# Patient Record
Sex: Female | Born: 1955 | Race: Black or African American | Hispanic: No | Marital: Married | Smoking: Current every day smoker
Health system: Southern US, Community
[De-identification: ages and names within clinical notes are randomized; demographics above are authoritative.]

## PROBLEM LIST (undated history)

## (undated) DIAGNOSIS — I10 Essential (primary) hypertension: Secondary | ICD-10-CM

## (undated) DIAGNOSIS — R609 Edema, unspecified: Secondary | ICD-10-CM

## (undated) DIAGNOSIS — J42 Unspecified chronic bronchitis: Secondary | ICD-10-CM

## (undated) DIAGNOSIS — J45909 Unspecified asthma, uncomplicated: Secondary | ICD-10-CM

## (undated) DIAGNOSIS — R87810 Cervical high risk human papillomavirus (HPV) DNA test positive: Secondary | ICD-10-CM

## (undated) DIAGNOSIS — M1990 Unspecified osteoarthritis, unspecified site: Secondary | ICD-10-CM

## (undated) DIAGNOSIS — E7800 Pure hypercholesterolemia, unspecified: Secondary | ICD-10-CM

## (undated) DIAGNOSIS — J449 Chronic obstructive pulmonary disease, unspecified: Secondary | ICD-10-CM

## (undated) HISTORY — DX: Unspecified asthma, uncomplicated: J45.909

## (undated) HISTORY — DX: Cervical high risk human papillomavirus (HPV) DNA test positive: R87.810

## (undated) HISTORY — DX: Unspecified chronic bronchitis: J42

## (undated) HISTORY — DX: Essential (primary) hypertension: I10

---

## 2001-08-16 ENCOUNTER — Encounter: Payer: Self-pay | Admitting: Internal Medicine

## 2001-08-16 ENCOUNTER — Ambulatory Visit (HOSPITAL_COMMUNITY): Admission: RE | Admit: 2001-08-16 | Discharge: 2001-08-16 | Payer: Self-pay | Admitting: Internal Medicine

## 2002-03-27 ENCOUNTER — Emergency Department (HOSPITAL_COMMUNITY): Admission: EM | Admit: 2002-03-27 | Discharge: 2002-03-27 | Payer: Self-pay | Admitting: Emergency Medicine

## 2002-08-25 ENCOUNTER — Encounter: Payer: Self-pay | Admitting: Internal Medicine

## 2002-08-25 ENCOUNTER — Ambulatory Visit (HOSPITAL_COMMUNITY): Admission: RE | Admit: 2002-08-25 | Discharge: 2002-08-25 | Payer: Self-pay | Admitting: Internal Medicine

## 2005-12-31 ENCOUNTER — Ambulatory Visit (HOSPITAL_COMMUNITY): Admission: RE | Admit: 2005-12-31 | Discharge: 2005-12-31 | Payer: Self-pay | Admitting: Internal Medicine

## 2006-12-07 HISTORY — PX: COLONOSCOPY: SHX174

## 2007-01-18 ENCOUNTER — Ambulatory Visit (HOSPITAL_COMMUNITY): Admission: RE | Admit: 2007-01-18 | Discharge: 2007-01-18 | Payer: Self-pay | Admitting: Internal Medicine

## 2007-10-25 ENCOUNTER — Ambulatory Visit (HOSPITAL_COMMUNITY): Admission: RE | Admit: 2007-10-25 | Discharge: 2007-10-25 | Payer: Self-pay | Admitting: Gastroenterology

## 2007-10-25 ENCOUNTER — Ambulatory Visit: Payer: Self-pay | Admitting: Gastroenterology

## 2008-03-06 ENCOUNTER — Ambulatory Visit (HOSPITAL_COMMUNITY): Admission: RE | Admit: 2008-03-06 | Discharge: 2008-03-06 | Payer: Self-pay | Admitting: Internal Medicine

## 2010-12-29 ENCOUNTER — Encounter: Payer: Self-pay | Admitting: Internal Medicine

## 2011-04-21 NOTE — Op Note (Signed)
Dana Drake, Dana Drake                ACCOUNT NO.:  [id number]   MEDICAL RECORD NO.:  [id number]          PATIENT TYPE:  AMB   LOCATION:  DAY                           FACILITY:  [facility]   PHYSICIAN:  Kassie Mends, M.D.      DATE OF BIRTH:  [birth year]   DATE OF PROCEDURE:  10/25/2007  DATE OF DISCHARGE:                               OPERATIVE REPORT   PROCEDURE:  Colonoscopy.   INDICATION FOR EXAM:  Ms. Bickhart is a 55 year old female who presents  for average risk colon cancer screening.   FINDINGS:  1. Single diverticulum seen at the hepatic flexure.  Rare sigmoid      diverticula seen.  Otherwise no polyps, masses, inflammatory      changes or arteriovenous malformations seen.  2. Small internal hemorrhoids.  Otherwise normal retroflexed view of      the rectum.   RECOMMENDATIONS:  1. Screening colonoscopy in 10 years.  2. She is given handout on high-fiber diet.  She should follow a high      fiber diet to decrease her risk colon cancer and for management of      her diverticulosis.  She is given a handout on diverticulosis.   MEDICATIONS:  1. Demerol 50 mg IV.  2. Versed 4 mg IV.   PROCEDURE TECHNIQUE:  Physical exam was performed.  Informed consent was  obtained from the patient after explaining the benefits, risks and  alternatives to the procedure.  The patient was connected to monitor and  placed in left lateral position.  Continuous oxygen was provided by  nasal cannula and IV medicine administered through an indwelling  cannula.  After administration of sedation and rectal exam, the  patient's rectum was intubated and the  scope was advanced under direct visualization to the cecum.  The scope  was removed slowly by carefully examining the color, texture, anatomy  and integrity of the mucosa on the way out.  The patient was recovered  in endoscopy and discharged home in satisfactory condition.      Kassie Mends, M.D.  Electronically Signed     SM/MEDQ  D:   10/25/2007  T:  10/25/2007  Job:  [id number]   cc:   Tesfaye D. Felecia Shelling, MD  Fax: [phone]

## 2012-04-29 ENCOUNTER — Emergency Department (HOSPITAL_COMMUNITY)
Admission: EM | Admit: 2012-04-29 | Discharge: 2012-04-29 | Disposition: A | Discharge status: Home / Self Care | Payer: Medicaid Other | Attending: Emergency Medicine | Admitting: Emergency Medicine

## 2012-04-29 ENCOUNTER — Encounter (HOSPITAL_COMMUNITY): Payer: Self-pay | Admitting: *Deleted

## 2012-04-29 ENCOUNTER — Emergency Department (HOSPITAL_COMMUNITY): Payer: Medicaid Other

## 2012-04-29 DIAGNOSIS — J449 Chronic obstructive pulmonary disease, unspecified: Secondary | ICD-10-CM | POA: Insufficient documentation

## 2012-04-29 DIAGNOSIS — J4489 Other specified chronic obstructive pulmonary disease: Secondary | ICD-10-CM | POA: Insufficient documentation

## 2012-04-29 DIAGNOSIS — R609 Edema, unspecified: Secondary | ICD-10-CM | POA: Insufficient documentation

## 2012-04-29 DIAGNOSIS — E7800 Pure hypercholesterolemia, unspecified: Secondary | ICD-10-CM | POA: Insufficient documentation

## 2012-04-29 DIAGNOSIS — F17200 Nicotine dependence, unspecified, uncomplicated: Secondary | ICD-10-CM | POA: Insufficient documentation

## 2012-04-29 HISTORY — DX: Pure hypercholesterolemia, unspecified: E78.00

## 2012-04-29 HISTORY — DX: Edema, unspecified: R60.9

## 2012-04-29 HISTORY — DX: Chronic obstructive pulmonary disease, unspecified: J44.9

## 2012-04-29 LAB — BASIC METABOLIC PANEL
CO2: 26 mEq/L (ref 19–32)
Calcium: 10.5 mg/dL (ref 8.4–10.5)
GFR calc Af Amer: >90 mL/min (ref >90)
Sodium: 139 mEq/L (ref 135–145)

## 2012-04-29 LAB — DIFFERENTIAL
Basophils Absolute: 0 10*3/uL (ref 0.0–0.1)
Eosinophils Relative: 3 % (ref 0–5)
Lymphocytes Relative: 54 % — ABNORMAL HIGH (ref 12–46)
Neutro Abs: 2.2 10*3/uL (ref 1.7–7.7)

## 2012-04-29 LAB — CBC
Platelets: 240 10*3/uL (ref 150–400)
RDW: 14.3 % (ref 11.5–15.5)
WBC: 6.1 10*3/uL (ref 4.0–10.5)

## 2012-04-29 LAB — TROPONIN I: Troponin I: <0.3 ng/mL (ref <0.30)

## 2012-04-29 MED ORDER — FUROSEMIDE 20 MG PO TABS: 20.0000 mg | ORAL_TABLET | Freq: Two times a day (BID) | ORAL | Status: DC

## 2012-04-29 NOTE — Discharge Instructions (Signed)
Stay on a low salt diet.  Edema Edema is an abnormal build-up of fluids in tissues. Because this is partly dependent on gravity (water flows to the lowest place), it is more common in the leg sand thighs (lower extremities). It is also common in the looser tissues, like around the eyes. Painless swelling of the feet and ankles is common and increases as a person ages. It may affect both legs and may include the calves or even thighs. When squeezed, the fluid may move out of the affected area and may leave a dent for a few moments. CAUSES   Prolonged standing or sitting in one place for extended periods of time. Movement helps pump tissue fluid into the veins, and absence of movement prevents this, resulting in edema.   Varicose veins. The valves in the veins do not work as well as they should. This causes fluid to leak into the tissues.   Fluid and salt overload.   Injury, burn, or surgery to the leg, ankle, or foot, may damage veins and allow fluid to leak out.   Sunburn damages vessels. Leaky vessels allow fluid to go out into the sunburned tissues.   Allergies (from insect bites or stings, medications or chemicals) cause swelling by allowing vessels to become leaky.   Protein in the blood helps keep fluid in your vessels. Low protein, as in malnutrition, allows fluid to leak out.   Hormonal changes, including pregnancy and menstruation, cause fluid retention. This fluid may leak out of vessels and cause edema.   Medications that cause fluid retention. Examples are sex hormones, blood pressure medications, steroid treatment, or anti-depressants.   Some illnesses cause edema, especially heart failure, kidney disease, or liver disease.   Surgery that cuts veins or lymph nodes, such as surgery done for the heart or for breast cancer, may result in edema.  DIAGNOSIS  Your caregiver is usually easily able to determine what is causing your swelling (edema) by simply asking what is wrong  (getting a history) and examining you (doing a physical). Sometimes x-rays, EKG (electrocardiogram or heart tracing), and blood work may be done to evaluate for underlying medical illness. TREATMENT  General treatment includes:  Leg elevation (or elevation of the affected body part).   Restriction of fluid intake.   Prevention of fluid overload.   Compression of the affected body part. Compression with elastic bandages or support stockings squeezes the tissues, preventing fluid from entering and forcing it back into the blood vessels.   Diuretics (also called water pills or fluid pills) pull fluid out of your body in the form of increased urination. These are effective in reducing the swelling, but can have side effects and must be used only under your caregiver's supervision. Diuretics are appropriate only for some types of edema.  The specific treatment can be directed at any underlying causes discovered. Heart, liver, or kidney disease should be treated appropriately. HOME CARE INSTRUCTIONS   Elevate the legs (or affected body part) above the level of the heart, while lying down.   Avoid sitting or standing still for prolonged periods of time.   Avoid putting anything directly under the knees when lying down, and do not wear constricting clothing or garters on the upper legs.   Exercising the legs causes the fluid to work back into the veins and lymphatic channels. This may help the swelling go down.   The pressure applied by elastic bandages or support stockings can help reduce ankle swelling.  A low-salt diet may help reduce fluid retention and decrease the ankle swelling.   Take any medications exactly as prescribed.  SEEK MEDICAL CARE IF:  Your edema is not responding to recommended treatments. SEEK IMMEDIATE MEDICAL CARE IF:   You develop shortness of breath or chest pain.   You cannot breathe when you lay down; or if, while lying down, you have to get up and go to the  window to get your breath.   You are having increasing swelling without relief from treatment.   You develop a fever over 102 F (38.9 C).   You develop pain or redness in the areas that are swollen.   Tell your caregiver right away if you have gained 3 lb/1.4 kg in 1 day or 5 lb/2.3 kg in a week.  MAKE SURE YOU:   Understand these instructions.   Will watch your condition.   Will get help right away if you are not doing well or get worse.  Document Released: 11/23/2005 Document Revised: 11/12/2011 Document Reviewed: 07/11/2008 [facility] Patient Information 2012 [place], [place].  Furosemide tablets What is this medicine? FUROSEMIDE (fyoor [place] se mide) is a diuretic. It helps you make more urine and to lose salt and excess water from your body. This medicine is used to treat high blood pressure, and edema or swelling from heart, kidney, or liver disease. This medicine may be used for other purposes; ask your health care provider or pharmacist if you have questions. What should I tell my health care provider before I take this medicine? They need to know if you have any of these conditions: -abnormal blood electrolytes -diarrhea or vomiting -gout -heart disease -kidney disease, small amounts of urine, or difficulty passing urine -liver disease -an unusual or allergic reaction to furosemide, sulfa drugs, other medicines, foods, dyes, or preservatives -pregnant or trying to get pregnant -breast-feeding How should I use this medicine? Take this medicine by mouth with a glass of water. Follow the directions on the prescription label. You may take this medicine with or without food. If it upsets your stomach, take it with food or milk. Do not take your medicine more often than directed. Remember that you will need to pass more urine after taking this medicine. Do not take your medicine at a time of day that will cause you problems. Do not take at bedtime. Talk to your pediatrician  regarding the use of this medicine in children. While this drug may be prescribed for selected conditions, precautions do apply. Overdosage: If you think you have taken too much of this medicine contact a poison control center or emergency room at once. NOTE: This medicine is only for you. Do not share this medicine with others. What if I miss a dose? If you miss a dose, take it as soon as you can. If it is almost time for your next dose, take only that dose. Do not take double or extra doses. What may interact with this medicine? -aspirin and aspirin-like medicines -certain antibiotics -chloral hydrate -cisplatin -cyclosporine -digoxin -diuretics -laxatives -lithium -medicines for blood pressure -medicines that relax muscles for surgery -methotrexate -NSAIDs, medicines for pain and inflammation like ibuprofen, naproxen, or indomethacin -phenytoin -steroid medicines like prednisone or cortisone -sucralfate This list may not describe all possible interactions. Give your health care provider a list of all the medicines, herbs, non-prescription drugs, or dietary supplements you use. Also tell them if you smoke, drink alcohol, or use illegal drugs. Some items may interact with your  medicine. What should I watch for while using this medicine? Visit your doctor or health care professional for regular checks on your progress. Check your blood pressure regularly. Ask your doctor or health care professional what your blood pressure should be, and when you should contact him or her. If you are a diabetic, check your blood sugar as directed. You may need to be on a special diet while taking this medicine. Check with your doctor. Also, ask how many glasses of fluid you need to drink a day. You must not get dehydrated. You may get drowsy or dizzy. Do not drive, use machinery, or do anything that needs mental alertness until you know how this drug affects you. Do not stand or sit up quickly, especially  if you are an older patient. This reduces the risk of dizzy or fainting spells. Alcohol can make you more drowsy and dizzy. Avoid alcoholic drinks. This medicine can make you more sensitive to the sun. Keep out of the sun. If you cannot avoid being in the sun, wear protective clothing and use sunscreen. Do not use sun lamps or tanning beds/booths. What side effects may I notice from receiving this medicine? Side effects that you should report to your doctor or health care professional as soon as possible: -blood in urine or stools -dry mouth -fever or chills -hearing loss or ringing in the ears -irregular heartbeat -muscle pain or weakness, cramps -skin rash -stomach upset, pain, or nausea -tingling or numbness in the hands or feet -unusually weak or tired -vomiting or diarrhea -yellowing of the eyes or skin Side effects that usually do not require medical attention (report to your doctor or health care professional if they continue or are bothersome): -headache -loss of appetite -unusual bleeding or bruising This list may not describe all possible side effects. Call your doctor for medical advice about side effects. You may report side effects to FDA at 1-800-FDA-1088. Where should I keep my medicine? Keep out of the reach of children. Store at room temperature between 15 and 30 degrees C (59 and 86 degrees F). Protect from light. Throw away any unused medicine after the expiration date. NOTE: This sheet is a summary. It may not cover all possible information. If you have questions about this medicine, talk to your doctor, pharmacist, or health care provider.  2012, [employer]. (11/11/2009 4:24:50 PM)

## 2012-04-29 NOTE — ED Provider Notes (Signed)
History     CSN: [id number]  Arrival date & time 04/29/12  1535   First MD Initiated Contact with Patient 04/29/12 1702      Chief Complaint  Patient presents with  . Leg Pain    (Consider location/radiation/quality/duration/timing/severity/associated sxs/prior treatment) Patient is a 56 y.o. female presenting with leg pain. The history is provided by the patient.  Leg Pain   She has been having swelling in her legs over the last 4 days. Is getting gradually worse. She has a history of peripheral edema and has not been able to obtain her medications because of cost. Is also complaining of some aching in her calves and knees. She has had some mild, intermittent chest pain with the last episode of chest pain being yesterday. She also has some mild dyspnea. There's been no nausea or vomiting or diarrhea. She denies fever, chills, sweats. She has been on water pills in the past.  Past Medical History  Diagnosis Date  . COPD (chronic obstructive pulmonary disease)   . High cholesterol   . Fluid retention     History reviewed. No pertinent past surgical history.  No family history on file.  History  Substance Use Topics  . Smoking status: Current Everyday Smoker    Types: Cigarettes  . Smokeless tobacco: Not on file  . Alcohol Use: No    OB History    Grav Para Term Preterm Abortions TAB SAB Ect Mult Living                  Review of Systems  All other systems reviewed and are negative.    Allergies  Review of patient's allergies indicates no known allergies.  Home Medications  No current outpatient prescriptions on file.  BP 141/70  Pulse 85  Temp(Src) 99 F (37.2 C) (Oral)  Resp 20  Ht 5' (1.524 m)  Wt 152 lb (68.947 kg)  BMI 29.69 kg/m2  SpO2 99%  Physical Exam  Nursing note and vitals reviewed.  56 year old female is resting comfortably and in no acute distress. Vital signs are significant for borderline hypertension with blood pressure 141/70. Oxygen  saturation is 99% which is normal. Head is normal cephalic and atraumatic. PERRLA, EOMI. This is scleral icterus. Oropharynx is clear. Neck is nontender and supple without adenopathy or JVD. Back is nontender. Lungs are clear without rales, wheezes, or rhonchi. Heart has regular rate rhythm without murmur. Abdomen is soft, flat, nontender without masses or hepatosplenomegaly. Extremities have 1+ edema. There is no erythema or warmth. There is no palpable cords. Negative Homans sign. Skin is dry without rash. Neurologic: Mental status is normal, cranial nerves are intact, there are no motor or sensory deficits.  ED Course  Procedures (including critical care time)  Results for orders placed during the hospital encounter of 04/29/12  CBC      Component Value Range   WBC 6.1  4.0 - 10.5 (K/uL)   RBC 4.47  3.87 - 5.11 (MIL/uL)   Hemoglobin 13.4  12.0 - 15.0 (g/dL)   HCT 40.9  81.1 - 91.4 (%)   MCV 87.7  78.0 - 100.0 (fL)   MCH 30.0  26.0 - 34.0 (pg)   MCHC 34.2  30.0 - 36.0 (g/dL)   RDW 78.2  95.6 - 21.3 (%)   Platelets 240  150 - 400 (K/uL)  DIFFERENTIAL      Component Value Range   Neutrophils Relative 36 (*) 43 - 77 (%)   Neutro Abs 2.2  1.7 - 7.7 (K/uL)   Lymphocytes Relative 54 (*) 12 - 46 (%)   Lymphs Abs 3.3  0.7 - 4.0 (K/uL)   Monocytes Relative 7  3 - 12 (%)   Monocytes Absolute 0.4  0.1 - 1.0 (K/uL)   Eosinophils Relative 3  0 - 5 (%)   Eosinophils Absolute 0.2  0.0 - 0.7 (K/uL)   Basophils Relative 1  0 - 1 (%)   Basophils Absolute 0.0  0.0 - 0.1 (K/uL)  BASIC METABOLIC PANEL      Component Value Range   Sodium 139  135 - 145 (mEq/L)   Potassium 4.6  3.5 - 5.1 (mEq/L)   Chloride 103  96 - 112 (mEq/L)   CO2 26  19 - 32 (mEq/L)   Glucose, Bld 95  70 - 99 (mg/dL)   BUN 12  6 - 23 (mg/dL)   Creatinine, Ser 1.61  0.50 - 1.10 (mg/dL)   Calcium 09.6  8.4 - 10.5 (mg/dL)   GFR calc non Af Amer >90  >90 (mL/min)   GFR calc Af Amer >90  >90 (mL/min)  TROPONIN I      Component  Value Range   Troponin I <0.30  <0.30 (ng/mL)   Dg Chest 2 View  04/29/2012  *RADIOLOGY REPORT*  Clinical Data: Chest pain.  Bilateral leg swelling.  CHEST - 2 VIEW  Comparison: None.  Findings: The heart size and pulmonary vascularity are normal and the lungs are clear.  No acute osseous abnormality.  IMPRESSION: Normal exam.  Original Report Authenticated By: Gwynn Burly, M.D.    Date: 04/29/2012  Rate: 60  Rhythm: normal sinus rhythm  QRS Axis: normal  Intervals: normal  ST/T Wave abnormalities: nonspecific T wave changes  Conduction Disutrbances:Incomplete right bundle-branch block  Narrative Interpretation: Q waves in the anteroseptal leads consistent with old anteroseptal myocardial infarction, incomplete right bundle-branch block, nonspecific T wave flattening diffusely. No old ECG available for comparison.  Old EKG Reviewed: none available     1. Peripheral edema       MDM  Peripheral edema. EKG and troponin will be checked given chest pain. Since last episode of pain was yesterday, a single troponin will be sufficient to rule out cardiac injury. Chest x-ray or be obtained to evaluate for possible congestive heart failure.   Workup is unremarkable. She is sent home with a prescription for Lasix.     Dione Booze, MD 04/29/12 [phone]

## 2012-04-29 NOTE — ED Notes (Signed)
Patient with no complaints at this time. Respirations even and unlabored. Skin warm/dry. Discharge instructions reviewed with patient at this time. Patient given opportunity to voice concerns/ask questions. Patient discharged at this time and left Emergency Department with steady gait.   

## 2012-04-29 NOTE — ED Notes (Signed)
Reports increased fluid/swelling/pain to bil legs x 5 days.

## 2014-06-29 ENCOUNTER — Other Ambulatory Visit (HOSPITAL_COMMUNITY): Payer: Self-pay | Admitting: Internal Medicine

## 2014-06-29 DIAGNOSIS — Z1231 Encounter for screening mammogram for malignant neoplasm of breast: Secondary | ICD-10-CM

## 2014-07-04 ENCOUNTER — Ambulatory Visit (HOSPITAL_COMMUNITY): Payer: Medicaid Other

## 2014-07-16 ENCOUNTER — Ambulatory Visit (HOSPITAL_COMMUNITY)
Admission: RE | Admit: 2014-07-16 | Discharge: 2014-07-16 | Disposition: A | Discharge status: Home / Self Care | Payer: Medicaid Other | Source: Ambulatory Visit | Attending: Internal Medicine | Admitting: Internal Medicine

## 2014-07-16 DIAGNOSIS — Z1231 Encounter for screening mammogram for malignant neoplasm of breast: Secondary | ICD-10-CM | POA: Diagnosis present

## 2015-10-17 ENCOUNTER — Other Ambulatory Visit (HOSPITAL_COMMUNITY): Payer: Self-pay | Admitting: Internal Medicine

## 2015-10-17 DIAGNOSIS — Z1231 Encounter for screening mammogram for malignant neoplasm of breast: Secondary | ICD-10-CM

## 2015-10-23 ENCOUNTER — Ambulatory Visit (HOSPITAL_COMMUNITY): Payer: Medicaid Other

## 2016-07-31 ENCOUNTER — Other Ambulatory Visit (HOSPITAL_COMMUNITY): Payer: Self-pay | Admitting: Internal Medicine

## 2016-07-31 DIAGNOSIS — Z1231 Encounter for screening mammogram for malignant neoplasm of breast: Secondary | ICD-10-CM

## 2016-08-06 ENCOUNTER — Ambulatory Visit (HOSPITAL_COMMUNITY)
Admission: RE | Admit: 2016-08-06 | Discharge: 2016-08-06 | Disposition: A | Discharge status: Home / Self Care | Payer: Medicaid Other | Source: Ambulatory Visit | Attending: Internal Medicine | Admitting: Internal Medicine

## 2016-08-06 DIAGNOSIS — Z1231 Encounter for screening mammogram for malignant neoplasm of breast: Secondary | ICD-10-CM | POA: Insufficient documentation

## 2016-11-11 ENCOUNTER — Encounter: Payer: Self-pay | Admitting: *Deleted

## 2016-11-20 ENCOUNTER — Encounter: Payer: Self-pay | Admitting: Adult Health

## 2016-11-20 ENCOUNTER — Other Ambulatory Visit (HOSPITAL_COMMUNITY)
Admission: RE | Admit: 2016-11-20 | Discharge: 2016-11-20 | Disposition: A | Discharge status: Home / Self Care | Payer: Medicaid Other | Source: Ambulatory Visit | Attending: Adult Health | Admitting: Adult Health

## 2016-11-20 ENCOUNTER — Ambulatory Visit (INDEPENDENT_AMBULATORY_CARE_PROVIDER_SITE_OTHER): Payer: Medicaid Other | Admitting: Adult Health

## 2016-11-20 VITALS — BP 142/72 | HR 97 | Ht 61.0 in | Wt 152.0 lb

## 2016-11-20 DIAGNOSIS — Z01419 Encounter for gynecological examination (general) (routine) without abnormal findings: Secondary | ICD-10-CM | POA: Diagnosis present

## 2016-11-20 DIAGNOSIS — Z0000 Encounter for general adult medical examination without abnormal findings: Secondary | ICD-10-CM

## 2016-11-20 DIAGNOSIS — Z1151 Encounter for screening for human papillomavirus (HPV): Secondary | ICD-10-CM | POA: Insufficient documentation

## 2016-11-20 DIAGNOSIS — Z1212 Encounter for screening for malignant neoplasm of rectum: Secondary | ICD-10-CM

## 2016-11-20 LAB — HEMOCCULT GUIAC POC 1CARD (OFFICE): Fecal Occult Blood, POC: NEGATIVE

## 2016-11-20 NOTE — Progress Notes (Signed)
Patient ID: Dana Drake, female   DOB: 1956-01-07, 60 y.o.   MRN: [id number] History of Present Illness: Brannon is a 60 year old black female, married in for pap and pelvic exam. Has been over 3 years since last pap, no history of abnormal pap smears that she remembers.  PCP is Dr Legrand Rams.    Current Medications, Allergies, Past Medical History, Past Surgical History, Family History and Social History were reviewed in [employer] record.     Review of Systems: Patient denies any headaches, hearing loss, fatigue, blurred vision, shortness of breath, chest pain, abdominal pain, problems with bowel movements, urination, or intercourse(not having sex). No joint pain or mood swings.    Physical Exam:BP (!) 142/72 (BP Location: Right Arm, Patient Position: Sitting, Cuff Size: Normal)   Pulse 97   Ht 5\' 1"  (1.549 m)   Wt 152 lb (68.9 kg)   BMI 28.72 kg/m  General:  Well developed, well nourished, no acute distress Skin:  Warm and dry Neck:  Midline trachea, normal thyroid, good ROM, no lymphadenopathy, no carotid bruits heard Lungs; Clear to auscultation bilaterally Breast:  Deferred, pt says she had normal mammogram recently Cardiovascular: Regular rate and rhythm Abdomen:  Soft, non tender, no hepatosplenomegaly Pelvic:  External genitalia is normal in appearance, no lesions.  The vagina has decreased color,moisture and rugae. Urethra has no lesions or masses. The cervix is bulbous, pap with HPV performed.  Uterus is felt to be normal size, shape, and contour.  No adnexal masses or tenderness noted.Bladder is non tender, no masses felt. Rectal: Good sphincter tone, no polyps, or hemorrhoids felt.  Hemoccult negative. Extremities/musculoskeletal:  No swelling or varicosities noted, no clubbing or cyanosis Psych:  No mood changes, alert and cooperative,seems happy PHQ 2 score 0.  Impression: 1. Encounter for routine gynecological examination with Papanicolaou smear of  cervix       Plan: Pap in 3 years if normal Physical and labs with PCP Mammogram yearly Colonoscopy advised

## 2016-11-20 NOTE — Patient Instructions (Signed)
Pap in 3 years if normal  

## 2016-11-20 NOTE — Addendum Note (Signed)
Addended by: Traci Sermon A on: 11/20/2016 01:22 PM   Modules accepted: Orders

## 2016-11-24 ENCOUNTER — Telehealth: Payer: Self-pay | Admitting: Adult Health

## 2016-11-24 LAB — CYTOLOGY - PAP
Diagnosis: NEGATIVE
HPV: DETECTED — AB

## 2016-11-24 NOTE — Telephone Encounter (Signed)
Number not working

## 2016-12-02 ENCOUNTER — Encounter: Payer: Self-pay | Admitting: Adult Health

## 2016-12-02 DIAGNOSIS — R87810 Cervical high risk human papillomavirus (HPV) DNA test positive: Secondary | ICD-10-CM | POA: Insufficient documentation

## 2016-12-02 HISTORY — DX: Cervical high risk human papillomavirus (HPV) DNA test positive: R87.810

## 2017-10-20 ENCOUNTER — Encounter: Payer: Self-pay | Admitting: Gastroenterology

## 2017-11-22 ENCOUNTER — Other Ambulatory Visit: Payer: Self-pay | Admitting: Adult Health

## 2017-11-23 ENCOUNTER — Encounter: Payer: Self-pay | Admitting: Adult Health

## 2017-11-23 ENCOUNTER — Ambulatory Visit: Payer: Medicaid Other | Admitting: Adult Health

## 2017-11-23 ENCOUNTER — Other Ambulatory Visit (HOSPITAL_COMMUNITY)
Admission: RE | Admit: 2017-11-23 | Discharge: 2017-11-23 | Disposition: A | Discharge status: Home / Self Care | Payer: Medicaid Other | Source: Ambulatory Visit | Attending: Adult Health | Admitting: Adult Health

## 2017-11-23 VITALS — BP 130/70 | HR 88 | Ht 60.0 in | Wt 142.5 lb

## 2017-11-23 DIAGNOSIS — R87810 Cervical high risk human papillomavirus (HPV) DNA test positive: Secondary | ICD-10-CM | POA: Diagnosis not present

## 2017-11-23 DIAGNOSIS — Z01419 Encounter for gynecological examination (general) (routine) without abnormal findings: Secondary | ICD-10-CM

## 2017-11-23 DIAGNOSIS — Z124 Encounter for screening for malignant neoplasm of cervix: Secondary | ICD-10-CM

## 2017-11-23 DIAGNOSIS — Z0001 Encounter for general adult medical examination with abnormal findings: Secondary | ICD-10-CM | POA: Diagnosis not present

## 2017-11-23 DIAGNOSIS — Z1212 Encounter for screening for malignant neoplasm of rectum: Secondary | ICD-10-CM | POA: Diagnosis not present

## 2017-11-23 DIAGNOSIS — Z1211 Encounter for screening for malignant neoplasm of colon: Secondary | ICD-10-CM | POA: Insufficient documentation

## 2017-11-23 DIAGNOSIS — Z01411 Encounter for gynecological examination (general) (routine) with abnormal findings: Secondary | ICD-10-CM | POA: Diagnosis not present

## 2017-11-23 LAB — HEMOCCULT GUIAC POC 1CARD (OFFICE): Fecal Occult Blood, POC: NEGATIVE

## 2017-11-23 NOTE — Progress Notes (Signed)
Patient ID: Dana Drake, female   DOB: 11/27/1956, 61 y.o.   MRN: [id number] History of Present Illness: Dana Drake is a 61 year old black female in for well woman gyn exam and pap, pap last year +HPV,negative for malignancy 11/20/16. PCP is Dr Legrand Rams.    Current Medications, Allergies, Past Medical History, Past Surgical History, Family History and Social History were reviewed in [employer] record.     Review of Systems:  Patient denies any headaches, hearing loss, fatigue, blurred vision, shortness of breath, chest pain, abdominal pain, problems with bowel movements, urination, or intercourse. No joint pain or mood swings.   Physical Exam:BP 130/70 (BP Location: Left Arm, Patient Position: Sitting, Cuff Size: Normal)   Pulse 88   Ht 5' (1.524 m)   Wt 142 lb 8 oz (64.6 kg)   BMI 27.83 kg/m  General:  Well developed, well nourished, no acute distress Skin:  Warm and dry Neck:  Midline trachea, normal thyroid, good ROM, no lymphadenopathy,no carotid bruits heard  Lungs; Clear to auscultation bilaterally Breast:  No dominant palpable mass, retraction, or nipple discharge Cardiovascular: Regular rate and rhythm Abdomen:  Soft, non tender, no hepatosplenomegaly Pelvic:  External genitalia is normal in appearance, no lesions.  The vagina is normal in appearance. Urethra has no lesions or masses. The cervix is bulbous. Pap with HPV and GC/CHL performed.  Uterus is felt to be normal size, shape, and contour.  No adnexal masses or tenderness noted.Bladder is non tender, no masses felt. Rectal: Good sphincter tone, no polyps, or hemorrhoids felt.  Hemoccult negative. Extremities/musculoskeletal:  No swelling or varicosities noted, no clubbing or cyanosis Psych:  No mood changes, alert and cooperative,seems happy PHQ 2 score 0.  Impression: 1. Encounter for routine gynecological examination with Papanicolaou smear of cervix   2. Routine cervical smear   3. Cervical high  risk HPV (human papillomavirus) test positive   4. Screening for colorectal cancer       Plan: Physical in 1 year Pap in 2021 if normal Labs with PCP  Mammogram advised now and yearly  Referred to Dr Oneida Alar for colonoscopy

## 2017-11-24 ENCOUNTER — Encounter: Payer: Self-pay | Admitting: Gastroenterology

## 2017-11-24 LAB — CYTOLOGY - PAP
CHLAMYDIA, DNA PROBE: NEGATIVE
Diagnosis: NEGATIVE
HPV: DETECTED — AB
Neisseria Gonorrhea: NEGATIVE

## 2017-11-26 ENCOUNTER — Telehealth: Payer: Self-pay | Admitting: Adult Health

## 2017-11-26 MED ORDER — METRONIDAZOLE 500 MG PO TABS: 500.0000 mg | ORAL_TABLET | Freq: Two times a day (BID) | ORAL | 0 refills | Status: DC

## 2017-11-26 NOTE — Telephone Encounter (Signed)
Left message that pap was negative for malignancy and GC/CHL but +HPV again , will need colpo, so call for colpo appt with Dr Elonda Husky and also had BV on pap ,rx for flagyl sent to  [facility]

## 2017-12-02 ENCOUNTER — Telehealth: Payer: Self-pay | Admitting: Adult Health

## 2017-12-02 NOTE — Telephone Encounter (Signed)
Left message to call me about pap

## 2017-12-13 ENCOUNTER — Encounter: Payer: Self-pay | Admitting: Adult Health

## 2017-12-14 ENCOUNTER — Ambulatory Visit (INDEPENDENT_AMBULATORY_CARE_PROVIDER_SITE_OTHER): Payer: Self-pay

## 2017-12-14 DIAGNOSIS — Z1211 Encounter for screening for malignant neoplasm of colon: Secondary | ICD-10-CM

## 2017-12-14 MED ORDER — PEG-KCL-NACL-NASULF-NA ASC-C 100 G PO SOLR: 1.0000 | ORAL | 0 refills | Status: DC

## 2017-12-14 NOTE — Progress Notes (Signed)
Gastroenterology Pre-Procedure Review  Request Date:12/14/17 Requesting Physician: Jennifer Griffin NP  PATIENT REVIEW QUESTIONS: The patient responded to the following health history questions as indicated:    1. Diabetes Melitis: no 2. Joint replacements in the past 12 months: no 3. Major health problems in the past 3 months: no 4. Has an artificial valve or MVP: no 5. Has a defibrillator: no 6. Has been advised in past to take antibiotics in advance of a procedure like teeth cleaning: no 7. Family history of colon cancer: no  8. Alcohol Use: no 9. History of sleep apnea: no  10. History of coronary artery or other vascular stents placed within the last 12 months: no 11. History of any prior anesthesia complications: no    MEDICATIONS & ALLERGIES:    Patient reports the following regarding taking any blood thinners:   Plavix? no Aspirin? no Coumadin? no Brilinta? no Xarelto? no Eliquis? no Pradaxa? no Savaysa? no Effient? no  Patient confirms/reports the following medications:  Current Outpatient Medications  Medication Sig Dispense Refill  . albuterol (PROAIR HFA) 108 (90 BASE) MCG/ACT inhaler Inhale 2 puffs into the lungs every 6 (six) hours as needed.    . atorvastatin (LIPITOR) 40 MG tablet Take 40 mg by mouth daily.    . BETA CAROTENE PO Take by mouth daily.    . lisinopril-hydrochlorothiazide (PRINZIDE,ZESTORETIC) 20-12.5 MG tablet Take 1 tablet by mouth daily.    . naproxen (NAPROSYN) 500 MG tablet Take 500 mg by mouth 2 (two) times daily with a meal.    . peg 3350 powder (MOVIPREP) 100 g SOLR Take 1 kit (200 g total) by mouth as directed. 1 kit 0  . UNABLE TO FIND Pain med-unsure of name-daily     No current facility-administered medications for this visit.     Patient confirms/reports the following allergies:  No Known Allergies  No orders of the defined types were placed in this encounter.   AUTHORIZATION INFORMATION Primary Insurance: [place] [employer],  ID #:  [id number] Pre-Cert / Auth required: no   SCHEDULE INFORMATION: Procedure has been scheduled as follows:  Date: 01/07/18 Time: 9:30  Location: [facility]  This Gastroenterology Pre-Precedure Review Form is being routed to the following provider(s): Anna Boone NP  

## 2017-12-14 NOTE — Patient Instructions (Signed)
SPLIT MOVIPREP INSTRUCTION SHEET  Please notify us immediately if you are diabetic, take iron supplements, or if you are on coumadin or any blood thinners.  Patient Name:  Dana Drake Date of procedure:  01/07/18 Time to register at [place] Stay:  8:30 Provider:  Dr. Lynn Ito will need to purchase 1 fleet enema   Please hold the following medications: none  01/06/18 1 Day prior to procedure:     CLEAR LIQUIDS ALL DAY--NO SOLID FOODS!  Diabetic Medication Instructions:  none   At 5:00 PM Begin the prep as follows:     Empty 1 pouch A and 1 pouch B into disposable container.  Add lukewarm drinking water to the top line of the container.  Mix to dissolve.  You can mix solution ahead of time & refrigerate prior to drinking.  The solution should be used within 24 hours.  The container is divided by 4 marks.  Every 15 minutes, drink the solution down to the next mark (approx 8 oz) until the liter is complete.  Be sure to drink 16 ounces of clear liquid of your choice (this is important to make sure you stay hydrated and the prep works)   [place]  ([place]). You may take heart, blood pressure, or breathing  medications  01/07/18  Day of Procedure    Diabetic medications adjustments for today:none   Four hours before your procedure at 5:30 complete prep as follows:  Empty 1 pouch A and 1 pouch B into disposable container.  Add lukewarm drinking water to the top line of the container.  Mix to dissolve.  You can mix solution ahead of time & refrigerate prior to drinking.  The solution should be used within 24 hours.  The container is divided by 4 marks.  Every 15 minutes, drink the solution down to the next mark (approx 8 oz) until the liter is complete.  You must complete the entire prep to ensure the most effective cleaning.  Be sure to drink 16 ounces of clear liquid of your choice (this is important to make sure you stay  hydrated and the prep works)  Should complete within 1 and 1/2 hours.   NOTHING TO EAT OR DRINK AFTER 7:30AM (2 hours before your procedure)  Give yourself one Fleet enema about 1 hour prior to leaving for the hospital.  You may take TYLENOL products.  Please continue your regular medications unless we have instructed you otherwise.     Please note, on the day of your procedure you MUST be accompanied by an adult who is willing to assume responsibility for you at time of discharge. If you do not have such person with you, your procedure will have to be rescheduled.                                                                                                                     Please leave ALL jewelry  at home prior to coming to the hospital for your procedure.   *It is your responsibility to check with your insurance company for the benefits of coverage you have for this procedure. Unfortunately, not all insurance companies have benefits to cover all or part of these types of procedures. It is your responsibility to check your benefits, however we will be glad to assist you with any codes your insurance company may need.   Please note that most insurance companies will not cover a screening colonoscopy for people under the age of 108  For example, with some insurance companies you may have benefits for a screening colonoscopy, but if polyps are found the diagnosis will change and then you may have a deductible that will need to be met. Please make sure you check your benefits for screening colonoscopy as well as a diagnostic colonoscopy.   CLEAR LIQUIDS: (NO RED) Jello Apple Juice  White Grape Juice Water Banana popsicles  Kool-Aid  Coffee(No cream or milk) Tea (No cream or milk) Soft drinks Broth (fat free beef/chicken/vegetable)  Clear liquids allow you to see your fingers on the other side of the glass.  Be sure they are NOT RED in color, cloudy, but CLEAR.  Do Not Eat: Dairy  products of any kind Cranberry juice Tomato or V8 Juice  Orange Juice Grapefruit Juice  Red Grape Juice Solid foods like cereal, oatmeal, yogurt, fruits, vegetables, creamed soups, eggs, bread, etc   HELPFUL HINTS TO MAKE DRINKING EASIER: -Make sure prep is extremely COLD.  Refrigerate the night before.  You may also put in freezer. -You may try mixing Crystal Light or Country Time Lemonade if you prefer.  MIx in small amounts.  Add more if necessary. -Trying drinking through a straw. -Rinse mouth with water or mouthwash between glasses to remove aftertaste. -Try sipping on a cold beverage/ice popsicles between glasses of prep. -Place a piece of sugar-free hard candy in mouth between glasses. -If you become nauseated, try consuming smaller amounts or stretch out the time between glasses.  Stop for 30 minutes to an hour & slowly start back drinking.  Call our office with any questions or concerns at [phone].  Thank You

## 2017-12-20 NOTE — Progress Notes (Signed)
Appropriate.

## 2018-01-03 ENCOUNTER — Telehealth: Payer: Self-pay | Admitting: *Deleted

## 2018-01-03 NOTE — Telephone Encounter (Signed)
Noted. She will need another nurse visit so we can reschedule her when she feels better.

## 2018-01-03 NOTE — Telephone Encounter (Signed)
Patient called in to cancel her procedure for 01/07/18. Patient states she has a flu. She reports she will call back to reschedule. I have called Hoyle Sauer and cancelled procedure. I have made Almyra Free aware.

## 2018-01-07 ENCOUNTER — Ambulatory Visit (HOSPITAL_COMMUNITY): Admission: RE | Admit: 2018-01-07 | Payer: Medicaid Other | Source: Ambulatory Visit | Admitting: Gastroenterology

## 2018-01-07 ENCOUNTER — Encounter (HOSPITAL_COMMUNITY): Admission: RE | Payer: Self-pay | Source: Ambulatory Visit

## 2018-01-07 SURGERY — COLONOSCOPY
Anesthesia: Moderate Sedation

## 2018-01-27 ENCOUNTER — Ambulatory Visit: Payer: Medicaid Other

## 2018-02-15 ENCOUNTER — Ambulatory Visit (INDEPENDENT_AMBULATORY_CARE_PROVIDER_SITE_OTHER): Payer: Medicaid Other

## 2018-02-15 DIAGNOSIS — Z1211 Encounter for screening for malignant neoplasm of colon: Secondary | ICD-10-CM

## 2018-02-15 NOTE — Patient Instructions (Addendum)
SPLIT MOVIPREP INSTRUCTION SHEET  Please notify us immediately if you are diabetic, take iron supplements, or if you are on coumadin or any blood thinners.  Patient Name:  Dana Drake Date of procedure:  03/07/18 Time to register at [place] Stay:  1:00 Provider:  Dr. Lynn Ito will need to purchase 1 fleet enema           03/06/18 1 Day prior to procedure:     CLEAR LIQUIDS ALL DAY--NO SOLID FOODS!    At 5:00 PM Begin the prep as follows:     Empty 1 pouch A and 1 pouch B into disposable container.  Add lukewarm drinking water to the top line of the container.  Mix to dissolve.  You can mix solution ahead of time & refrigerate prior to drinking.  The solution should be used within 24 hours.  The container is divided by 4 marks.  Every 15 minutes, drink the solution down to the next mark (approx 8 oz) until the liter is complete.  Be sure to drink 16 ounces of clear liquid of your choice (this is important to make sure you stay hydrated and the prep works)   [place]  ([place]). You may take heart, blood pressure, or breathing  medications      03/07/18  Day of Procedure      Five hours before your procedure at  9:00am complete prep as follows:  Empty 1 pouch A and 1 pouch B into disposable container.  Add lukewarm drinking water to the top line of the container.  Mix to dissolve.  You can mix solution ahead of time & refrigerate prior to drinking.  The solution should be used within 24 hours.  The container is divided by 4 marks.  Every 15 minutes, drink the solution down to the next mark (approx 8 oz) until the liter is complete.  You must complete the entire prep to ensure the most effective cleaning.  Be sure to drink 16 ounces of clear liquid of your choice (this is important to make sure you stay hydrated and the prep works)  Should complete within 1 and 1/2 hours.   NOTHING TO EAT OR DRINK AFTER   11:00    AM  (3 hours before your procedure)  Give yourself one Fleet enema about 1 hour prior to leaving for the hospital.  You may take TYLENOL products.  Please continue your regular medications unless we have instructed you otherwise.     Please note, on the day of your procedure you MUST be accompanied by an adult who is willing to assume responsibility for you at time of discharge. If you do not have such person with you, your procedure will have to be rescheduled.                                                                                                                     Please leave  ALL jewelry at home prior to coming to the hospital for your procedure.   *It is your responsibility to check with your insurance company for the benefits of coverage you have for this procedure. Unfortunately, not all insurance companies have benefits to cover all or part of these types of procedures. It is your responsibility to check your benefits, however we will be glad to assist you with any codes your insurance company may need.   Please note that most insurance companies will not cover a screening colonoscopy for people under the age of 59  For example, with some insurance companies you may have benefits for a screening colonoscopy, but if polyps are found the diagnosis will change and then you may have a deductible that will need to be met. Please make sure you check your benefits for screening colonoscopy as well as a diagnostic colonoscopy.   CLEAR LIQUIDS: (NO RED) Jello Apple Juice  White Grape Juice Water Banana popsicles  Kool-Aid  Coffee(No cream or milk) Tea (No cream or milk) Soft drinks Broth (fat free beef/chicken/vegetable)  Clear liquids allow you to see your fingers on the other side of the glass.  Be sure they are NOT RED in color, cloudy, but CLEAR.  Do Not Eat: Dairy products of any kind Cranberry juice Tomato or V8 Juice  Orange Juice Grapefruit Juice  Red Grape Juice Solid foods  like cereal, oatmeal, yogurt, fruits, vegetables, creamed soups, eggs, bread, etc   HELPFUL HINTS TO MAKE DRINKING EASIER: -Make sure prep is extremely COLD.  Refrigerate the night before.  You may also put in freezer. -You may try mixing Crystal Light or Country Time Lemonade if you prefer.  MIx in small amounts.  Add more if necessary. -Trying drinking through a straw. -Rinse mouth with water or mouthwash between glasses to remove aftertaste. -Try sipping on a cold beverage/ice popsicles between glasses of prep. -Place a piece of sugar-free hard candy in mouth between glasses. -If you become nauseated, try consuming smaller amounts or stretch out the time between glasses.  Stop for 30 minutes to an hour & slowly start back drinking.  Call our office with any questions or concerns at [phone].  Thank You.

## 2018-02-15 NOTE — Progress Notes (Signed)
Gastroenterology Pre-Procedure Review  Request Date:02/15/18 Requesting Physician: Derrek Monaco   PATIENT REVIEW QUESTIONS: The patient responded to the following health history questions as indicated:    Pt cancelled previous tcs d/t having the flu. Prep is already at the pharmacy. Pt will let me know if they have discarded it.   1. Diabetes Melitis: no 2. Joint replacements in the past 12 months: no 3. Major health problems in the past 3 months: no 4. Has an artificial valve or MVP: no 5. Has a defibrillator: no 6. Has been advised in past to take antibiotics in advance of a procedure like teeth cleaning: no 7. Family history of colon cancer: no  8. Alcohol Use: no 9. History of sleep apnea: no  10. History of coronary artery or other vascular stents placed within the last 12 months: no 11. History of any prior anesthesia complications: no    MEDICATIONS & ALLERGIES:    Patient reports the following regarding taking any blood thinners:   Plavix? no Aspirin? no Coumadin? no Brilinta? no Xarelto? no Eliquis? no Pradaxa? no Savaysa? no Effient? no  Patient confirms/reports the following medications:  Current Outpatient Medications  Medication Sig Dispense Refill  . albuterol (PROAIR HFA) 108 (90 BASE) MCG/ACT inhaler Inhale 2 puffs into the lungs every 6 (six) hours as needed for shortness of breath.     Marland Kitchen atorvastatin (LIPITOR) 40 MG tablet Take 40 mg by mouth daily.    Marland Kitchen BETA CAROTENE PO Take by mouth daily.    Marland Kitchen lisinopril-hydrochlorothiazide (PRINZIDE,ZESTORETIC) 20-12.5 MG tablet Take 1 tablet by mouth daily.    . naproxen (NAPROSYN) 500 MG tablet Take 500 mg by mouth 2 (two) times daily as needed for moderate pain.      No current facility-administered medications for this visit.     Patient confirms/reports the following allergies:  No Known Allergies  No orders of the defined types were placed in this encounter.   AUTHORIZATION INFORMATION Primary  Insurance: [employer] ,  ID #: [id number] Pre-Cert / Josem Kaufmann required: no   SCHEDULE INFORMATION: Procedure has been scheduled as follows:  Date:03/07/18 , Time: 2:00 Location: [facility]  Dr.Fields  This Gastroenterology Pre-Precedure Review Form is being routed to the following provider(s): Roseanne Kaufman NP

## 2018-02-17 NOTE — Progress Notes (Signed)
Appropriate.

## 2018-03-04 ENCOUNTER — Telehealth: Payer: Self-pay

## 2018-03-04 NOTE — Telephone Encounter (Signed)
Called pt, TCS for 03/07/18 changed to 12:15pm. Pt to arrive at 11:15am. Advised pt to drink prep that morning at 7:15am, NPO after 9:15am. Called and informed endo scheduler.

## 2018-03-07 ENCOUNTER — Encounter (HOSPITAL_COMMUNITY)
Admission: RE | Disposition: A | Discharge status: Home / Self Care | Payer: Self-pay | Source: Ambulatory Visit | Attending: Gastroenterology

## 2018-03-07 ENCOUNTER — Other Ambulatory Visit: Payer: Self-pay

## 2018-03-07 ENCOUNTER — Encounter (HOSPITAL_COMMUNITY): Payer: Self-pay | Admitting: *Deleted

## 2018-03-07 ENCOUNTER — Ambulatory Visit (HOSPITAL_COMMUNITY)
Admission: RE | Admit: 2018-03-07 | Discharge: 2018-03-07 | Disposition: A | Discharge status: Home / Self Care | Payer: Medicaid Other | Source: Ambulatory Visit | Attending: Gastroenterology | Admitting: Gastroenterology

## 2018-03-07 DIAGNOSIS — Z79899 Other long term (current) drug therapy: Secondary | ICD-10-CM | POA: Diagnosis not present

## 2018-03-07 DIAGNOSIS — M170 Bilateral primary osteoarthritis of knee: Secondary | ICD-10-CM | POA: Insufficient documentation

## 2018-03-07 DIAGNOSIS — E7800 Pure hypercholesterolemia, unspecified: Secondary | ICD-10-CM | POA: Insufficient documentation

## 2018-03-07 DIAGNOSIS — J449 Chronic obstructive pulmonary disease, unspecified: Secondary | ICD-10-CM | POA: Diagnosis not present

## 2018-03-07 DIAGNOSIS — K5730 Diverticulosis of large intestine without perforation or abscess without bleeding: Secondary | ICD-10-CM | POA: Diagnosis not present

## 2018-03-07 DIAGNOSIS — Z791 Long term (current) use of non-steroidal anti-inflammatories (NSAID): Secondary | ICD-10-CM | POA: Diagnosis not present

## 2018-03-07 DIAGNOSIS — F17210 Nicotine dependence, cigarettes, uncomplicated: Secondary | ICD-10-CM | POA: Insufficient documentation

## 2018-03-07 DIAGNOSIS — K635 Polyp of colon: Secondary | ICD-10-CM

## 2018-03-07 DIAGNOSIS — D123 Benign neoplasm of transverse colon: Secondary | ICD-10-CM | POA: Diagnosis not present

## 2018-03-07 DIAGNOSIS — Z1211 Encounter for screening for malignant neoplasm of colon: Secondary | ICD-10-CM | POA: Diagnosis not present

## 2018-03-07 DIAGNOSIS — I10 Essential (primary) hypertension: Secondary | ICD-10-CM | POA: Diagnosis not present

## 2018-03-07 DIAGNOSIS — K644 Residual hemorrhoidal skin tags: Secondary | ICD-10-CM | POA: Diagnosis not present

## 2018-03-07 HISTORY — DX: Unspecified osteoarthritis, unspecified site: M19.90

## 2018-03-07 HISTORY — PX: POLYPECTOMY: SHX5525

## 2018-03-07 HISTORY — PX: COLONOSCOPY: SHX5424

## 2018-03-07 SURGERY — COLONOSCOPY
Anesthesia: Moderate Sedation

## 2018-03-07 MED ORDER — SODIUM CHLORIDE 0.9 % IV SOLN
INTRAVENOUS | Status: DC
  Administered 2018-03-07: 12:00:00 via INTRAVENOUS

## 2018-03-07 MED ORDER — MIDAZOLAM HCL 5 MG/5ML IJ SOLN
INTRAMUSCULAR | Status: DC | PRN
  Administered 2018-03-07 (×2): 2 mg via INTRAVENOUS

## 2018-03-07 MED ORDER — MIDAZOLAM HCL 5 MG/5ML IJ SOLN
INTRAMUSCULAR | Status: AC
  Filled 2018-03-07: qty 10

## 2018-03-07 MED ORDER — STERILE WATER FOR IRRIGATION IR SOLN
Status: DC | PRN
  Administered 2018-03-07: 2.5 mL

## 2018-03-07 MED ORDER — MEPERIDINE HCL 100 MG/ML IJ SOLN
INTRAMUSCULAR | Status: DC | PRN
  Administered 2018-03-07 (×2): 25 mg via INTRAVENOUS

## 2018-03-07 MED ORDER — MEPERIDINE HCL 100 MG/ML IJ SOLN
INTRAMUSCULAR | Status: AC
  Filled 2018-03-07: qty 2

## 2018-03-07 NOTE — Discharge Instructions (Signed)
You have small EXTERNAL hemorrhoids and diverticulosis IN YOUR LEFT AND RIGHT COLON. YOU HAD ONE SMALL POLYP REMOVED.    DRINK WATER TO KEEP YOUR URINE LIGHT YELLOW.  FOLLOW A HIGH FIBER DIET. AVOID ITEMS THAT CAUSE BLOATING. See info below.  YOUR BIOPSY RESULTS WILL BE AVAILABLE IN 7 DAYS.  USE PREPARATION H FOUR TIMES  A DAY IF NEEDED TO RELIEVE RECTAL PAIN/PRESSURE/BLEEDING.  Next colonoscopy in 5-10 years.  Colonoscopy Care After Read the instructions outlined below and refer to this sheet in the next week. These discharge instructions provide you with general information on caring for yourself after you leave the hospital. While your treatment has been planned according to the most current medical practices available, unavoidable complications occasionally occur. If you have any problems or questions after discharge, call DR. Selvin Yun, [phone].  ACTIVITY  You may resume your regular activity, but move at a slower pace for the next 24 hours.   Take frequent rest periods for the next 24 hours.   Walking will help get rid of the air and reduce the bloated feeling in your belly (abdomen).   No driving for 24 hours (because of the medicine (anesthesia) used during the test).   You may shower.   Do not sign any important legal documents or operate any machinery for 24 hours (because of the anesthesia used during the test).    NUTRITION  Drink plenty of fluids.   You may resume your normal diet as instructed by your doctor.   Begin with a light meal and progress to your normal diet. Heavy or fried foods are harder to digest and may make you feel sick to your stomach (nauseated).   Avoid alcoholic beverages for 24 hours or as instructed.    MEDICATIONS  You may resume your normal medications.   WHAT YOU CAN EXPECT TODAY  Some feelings of bloating in the abdomen.   Passage of more gas than usual.   Spotting of blood in your stool or on the toilet paper  .  IF  YOU HAD POLYPS REMOVED DURING THE COLONOSCOPY:  Eat a soft diet IF YOU HAVE NAUSEA, BLOATING, ABDOMINAL PAIN, OR VOMITING.    FINDING OUT THE RESULTS OF YOUR TEST Not all test results are available during your visit. DR. Oneida Alar WILL CALL YOU WITHIN 14 DAYS OF YOUR PROCEDUE WITH YOUR RESULTS. Do not assume everything is normal if you have not heard from DR. Farzad Tibbetts, CALL HER OFFICE AT [phone].  SEEK IMMEDIATE MEDICAL ATTENTION AND CALL THE OFFICE: [phone] IF:  You have more than a spotting of blood in your stool.   Your belly is swollen (abdominal distention).   You are nauseated or vomiting.   You have a temperature over 101F.   You have abdominal pain or discomfort that is severe or gets worse throughout the day.  High-Fiber Diet A high-fiber diet changes your normal diet to include more whole grains, legumes, fruits, and vegetables. Changes in the diet involve replacing refined carbohydrates with unrefined foods. The calorie level of the diet is essentially unchanged. The Dietary Reference Intake (recommended amount) for adult males is 38 grams per day. For adult females, it is 25 grams per day. Pregnant and lactating women should consume 28 grams of fiber per day. Fiber is the intact part of a plant that is not broken down during digestion. Functional fiber is fiber that has been isolated from the plant to provide a beneficial effect in the body. PURPOSE  Increase stool  bulk.   Ease and regulate bowel movements.   Lower cholesterol.   REDUCE RISK OF COLON CANCER  INDICATIONS THAT YOU NEED MORE FIBER  Constipation and hemorrhoids.   Uncomplicated diverticulosis (intestine condition) and irritable bowel syndrome.   Weight management.   As a protective measure against hardening of the arteries (atherosclerosis), diabetes, and cancer.   GUIDELINES FOR INCREASING FIBER IN THE DIET  Start adding fiber to the diet slowly. A gradual increase of about 5 more grams (2  slices of whole-wheat bread, 2 servings of most fruits or vegetables, or 1 bowl of high-fiber cereal) per day is best. Too rapid an increase in fiber may result in constipation, flatulence, and bloating.   Drink enough water and fluids to keep your urine clear or pale yellow. Water, juice, or caffeine-free drinks are recommended. Not drinking enough fluid may cause constipation.   Eat a variety of high-fiber foods rather than one type of fiber.   Try to increase your intake of fiber through using high-fiber foods rather than fiber pills or supplements that contain small amounts of fiber.   The goal is to change the types of food eaten. Do not supplement your present diet with high-fiber foods, but replace foods in your present diet.   INCLUDE A VARIETY OF FIBER SOURCES  Replace refined and processed grains with whole grains, canned fruits with fresh fruits, and incorporate other fiber sources. White rice, white breads, and most bakery goods contain little or no fiber.   Brown whole-grain rice, buckwheat oats, and many fruits and vegetables are all good sources of fiber. These include: broccoli, Brussels sprouts, cabbage, cauliflower, beets, sweet potatoes, white potatoes (skin on), carrots, tomatoes, eggplant, squash, berries, fresh fruits, and dried fruits.   Cereals appear to be the richest source of fiber. Cereal fiber is found in whole grains and bran. Bran is the fiber-rich outer coat of cereal grain, which is largely removed in refining. In whole-grain cereals, the bran remains. In breakfast cereals, the largest amount of fiber is found in those with "bran" in their names. The fiber content is sometimes indicated on the label.   You may need to include additional fruits and vegetables each day.   In baking, for 1 cup white flour, you may use the following substitutions:   1 cup whole-wheat flour minus 2 tablespoons.   1/2 cup white flour plus 1/2 cup whole-wheat flour.   Polyps, Colon   A polyp is extra tissue that grows inside your body. Colon polyps grow in the large intestine. The large intestine, also called the colon, is part of your digestive system. It is a long, hollow tube at the end of your digestive tract where your body makes and stores stool. Most polyps are not dangerous. They are benign. This means they are not cancerous. But over time, some types of polyps can turn into cancer. Polyps that are smaller than a pea are usually not harmful. But larger polyps could someday become or may already be cancerous. To be safe, doctors remove all polyps and test them.   PREVENTION There is not one sure way to prevent polyps. You might be able to lower your risk of getting them if you:  Eat more fruits and vegetables and less fatty food.   Do not smoke.   Avoid alcohol.   Exercise every day.   Lose weight if you are overweight.   Eating more calcium and folate can also lower your risk of getting polyps. Some foods  that are rich in calcium are milk, cheese, and broccoli. Some foods that are rich in folate are chickpeas, kidney beans, and spinach.    Diverticulosis Diverticulosis is a common condition that develops when small pouches (diverticula) form in the wall of the colon. The risk of diverticulosis increases with age. It happens more often in people who eat a low-fiber diet. Most individuals with diverticulosis have no symptoms. Those individuals with symptoms usually experience belly (abdominal) pain, constipation, or loose stools (diarrhea).  HOME CARE INSTRUCTIONS  Increase the amount of fiber in your diet as directed by your caregiver or dietician. This may reduce symptoms of diverticulosis.   Drink at least 6 to 8 glasses of water each day to prevent constipation.   Try not to strain when you have a bowel movement.   Avoiding nuts and seeds to prevent complications is NOT NECESSARY.   FOODS HAVING HIGH FIBER CONTENT INCLUDE:  Fruits. Apple, peach, pear,  tangerine, raisins, prunes.   Vegetables. Brussels sprouts, asparagus, broccoli, cabbage, carrot, cauliflower, romaine lettuce, spinach, summer squash, tomato, winter squash, zucchini.   Starchy Vegetables. Baked beans, kidney beans, lima beans, split peas, lentils, potatoes (with skin).   Grains. Whole wheat bread, brown rice, bran flake cereal, plain oatmeal, white rice, shredded wheat, bran muffins.   SEEK IMMEDIATE MEDICAL CARE IF:  You develop increasing pain or severe bloating.   You have an oral temperature above 101F.   You develop vomiting or bowel movements that are bloody or black.   Hemorrhoids Hemorrhoids are dilated (enlarged) veins around the rectum. Sometimes clots will form in the veins. This makes them swollen and painful. These are called thrombosed hemorrhoids. Causes of hemorrhoids include:  Constipation.   Straining to have a bowel movement.   HEAVY LIFTING   HOME CARE INSTRUCTIONS  Eat a well balanced diet and drink 6 to 8 glasses of water every day to avoid constipation. You may also use a bulk laxative.   Avoid straining to have bowel movements.   Keep anal area dry and clean.   Do not use a donut shaped pillow or sit on the toilet for long periods. This increases blood pooling and pain.   Move your bowels when your body has the urge; this will require less straining and will decrease pain and pressure.

## 2018-03-07 NOTE — H&P (Addendum)
Primary Care Physician:  Rosita Fire, MD Primary Gastroenterologist:  Dr. Oneida Alar  Pre-Procedure History & Physical: HPI:  Dana Drake is a 62 y.o. female here for [place].  Past Medical History:  Diagnosis Date  . Arthritis    knees  . Asthma   . Cervical high risk HPV (human papillomavirus) test positive 12/02/2016  . Chronic bronchitis ([place])   . COPD (chronic obstructive pulmonary disease) ([place])   . Fluid retention   . High cholesterol   . Hypertension    Past Surgical History:  Procedure Laterality Date  . COLONOSCOPY  2008   SLF TIC/IH   Prior to Admission medications   Medication Sig Start Date End Date Taking? Authorizing Provider  albuterol (PROAIR HFA) 108 (90 BASE) MCG/ACT inhaler Inhale 2 puffs into the lungs every 6 (six) hours as needed for shortness of breath.    Yes [provider]  atorvastatin (LIPITOR) 40 MG tablet Take 40 mg by mouth daily.   Yes [provider]  lisinopril-hydrochlorothiazide (PRINZIDE,ZESTORETIC) 20-12.5 MG tablet Take 1 tablet by mouth daily.   Yes [provider]  naproxen (NAPROSYN) 500 MG tablet Take 500 mg by mouth 2 (two) times daily as needed for moderate pain.    Yes [provider]  BETA CAROTENE PO Take by mouth daily.    [provider]    Allergies as of 02/15/2018  . (No Known Allergies)    Family History  Problem Relation Age of Onset  . Hypertension Mother   . Cancer Father   . Colon cancer Neg Hx   . Colon polyps Neg Hx     Social History   Socioeconomic History  . Marital status: Married    Spouse name: Not on file  . Number of children: Not on file  . Years of education: Not on file  . Highest education level: Not on file  Occupational History  . Not on file  Social Needs  . Financial resource strain: Not on file  . Food insecurity:    Worry: Not on file    Inability: Not on file  . Transportation needs:    Medical: Not on file   Non-medical: Not on file  Tobacco Use  . Smoking status: Current Every Day Smoker    Packs/day: 0.25    Years: 46.00    Pack years: 11.50    Types: Cigarettes  . Smokeless tobacco: Never Used  Substance and Sexual Activity  . Alcohol use: No  . Drug use: No  . Sexual activity: Yes    Birth control/protection: Post-menopausal  Lifestyle  . Physical activity:    Days per week: Not on file    Minutes per session: Not on file  . Stress: Not on file  Relationships  . Social connections:    Talks on phone: Not on file    Gets together: Not on file    Attends religious service: Not on file    Active member of club or organization: Not on file    Attends meetings of clubs or organizations: Not on file    Relationship status: Not on file  . Intimate partner violence:    Fear of current or ex partner: Not on file    Emotionally abused: Not on file    Physically abused: Not on file    Forced sexual activity: Not on file  Other Topics Concern  . Not on file  Social History Narrative  . Not on file  Review of Systems: See HPI, otherwise negative ROS   Physical Exam: BP 113/67   Pulse 93   Temp 98.7 F (37.1 C) (Oral)   Resp 17   Ht 5\' 1"  (1.549 m)   Wt 139 lb (63 kg)   SpO2 100%   BMI 26.26 kg/m  General:   Alert,  pleasant and cooperative in NAD Head:  Normocephalic and atraumatic. Neck:  Supple; Lungs:  Clear throughout to auscultation.    Heart:  Regular rate and rhythm. Abdomen:  Soft, nontender and nondistended. Normal bowel sounds, without guarding, and without rebound.   Neurologic:  Alert and  oriented x4;  grossly normal neurologically.  Impression/Plan:     SCREENING  Plan:  1. TCS TODAY DISCUSSED PROCEDURE, BENEFITS, & RISKS: < 1% chance of medication reaction, bleeding, perforation, or rupture of spleen/liver.

## 2018-03-07 NOTE — Op Note (Signed)
[facility] Patient Name: Dana Drake Procedure Date: 03/07/2018 12:12 PM MRN: [id number] Date of Birth: 12-26-1955 Attending MD: Barney Drain MD, MD CSN: [id number] Age: 62 Admit Type: Outpatient Procedure:                Colonoscopy WITH COLD SNARE POLYPECTOMY Indications:              Screening for colorectal malignant neoplasm Providers:                Barney Drain MD, MD, Lurline Del, RN, Nelma Rothman,                            Technician Referring MD:             Rosita Fire MD, MD Medicines:                Meperidine 50 mg IV, Midazolam 4 mg IV Complications:            No immediate complications. Estimated Blood Loss:     Estimated blood loss was minimal. Procedure:                Pre-Anesthesia Assessment:                           - Prior to the procedure, a History and Physical                            was performed, and patient medications and                            allergies were reviewed. The patient's tolerance of                            previous anesthesia was also reviewed. The risks                            and benefits of the procedure and the sedation                            options and risks were discussed with the patient.                            All questions were answered, and informed consent                            was obtained. Prior Anticoagulants: The patient has                            taken naproxen, last dose was day of procedure. ASA                            Grade Assessment: II - A patient with mild systemic                            disease. After reviewing the risks and benefits,  the patient was deemed in satisfactory condition to                            undergo the procedure. After obtaining informed                            consent, the colonoscope was passed under direct                            vision. Throughout the procedure, the patient's                            blood  pressure, pulse, and oxygen saturations were                            monitored continuously. The EC-[id number] ([id number])                            scope was introduced through the anus and advanced                            to the the cecum, identified by appendiceal orifice                            and ileocecal valve. The colonoscopy was                            technically difficult and complex due to a tortuous                            colon. Successful completion of the procedure was                            aided by straightening and shortening the scope to                            obtain bowel loop reduction and COLOWRAP. The                            patient tolerated the procedure fairly well. The                            quality of the bowel preparation was good. The                            ileocecal valve, appendiceal orifice, and rectum                            were photographed. Scope In: 12:33:23 PM Scope Out: 12:53:52 PM Scope Withdrawal Time: 0 hours 17 minutes 40 seconds  Total Procedure Duration: 0 hours 20 minutes 29 seconds  Findings:      A 4 mm polyp was found in the hepatic flexure. The polyp was sessile.  The polyp was removed with a cold snare. Resection and retrieval were       complete.      Multiple small and large-mouthed diverticula were found in the entire       colon.      External hemorrhoids were found during retroflexion. The hemorrhoids       were small. Impression:               - One 4 mm polyp at the hepatic flexure, removed                            with a cold snare. Resected and retrieved.                           - MILD TO MODERATE Diverticulosis in the entire                            examined colon.                           - External hemorrhoids. Moderate Sedation:      Moderate (conscious) sedation was administered by the endoscopy nurse       and supervised by the endoscopist. The following parameters were        monitored: oxygen saturation, heart rate, blood pressure, and response       to care. Total physician intraservice time was 30 minutes. Recommendation:           - Repeat colonoscopy in 5-10 years for surveillance.                           - High fiber diet.                           - Continue present medications.                           - Await pathology results.                           - Patient has a contact number available for                            emergencies. The signs and symptoms of potential                            delayed complications were discussed with the                            patient. Return to normal activities tomorrow.                            Written discharge instructions were provided to the                            patient. Procedure Code(s):        --- Professional ---  [phone], Colonoscopy, flexible; with removal of                            tumor(s), polyp(s), or other lesion(s) by snare                            technique                           99152, Moderate sedation services provided by the                            same physician or other qualified health care                            professional performing the diagnostic or                            therapeutic service that the sedation supports,                            requiring the presence of an independent trained                            observer to assist in the monitoring of the                            patient's level of consciousness and physiological                            status; initial 15 minutes of intraservice time,                            patient age 43 years or older                           [phone], Moderate sedation services; each additional                            15 minutes intraservice time Diagnosis Code(s):        --- Professional ---                           Z12.11, Encounter for screening for malignant                             neoplasm of colon                           D12.3, Benign neoplasm of transverse colon (hepatic                            flexure or splenic flexure)                           K64.4, Residual hemorrhoidal  skin tags                           K57.30, Diverticulosis of large intestine without                            perforation or abscess without bleeding CPT copyright 2016 [employer]. All rights reserved. The codes documented in this report are preliminary and upon coder review may  be revised to meet current compliance requirements. Barney Drain, MD Barney Drain MD, MD 03/07/2018 1:00:53 PM This report has been signed electronically. Number of Addenda: 0

## 2018-03-10 ENCOUNTER — Telehealth: Payer: Self-pay | Admitting: Gastroenterology

## 2018-03-10 NOTE — Telephone Encounter (Signed)
Pt is aware of results. 

## 2018-03-10 NOTE — Telephone Encounter (Signed)
Please call pt. She had ONE HYPERPLASTIC POLYP removed.  You have small EXTERNAL hemorrhoids and diverticulosis IN YOUR LEFT AND RIGHT COLON. YOU HAD ONE SMALL POLYP REMOVED.    DRINK WATER TO KEEP YOUR URINE LIGHT YELLOW. FOLLOW A HIGH FIBER DIET. AVOID ITEMS THAT CAUSE BLOATING.  USE PREPARATION H FOUR TIMES  A DAY IF NEEDED TO RELIEVE RECTAL PAIN/PRESSURE/BLEEDING.  IT IS OK TO WAIT 10 YEARS TO HAVE HER NEXT colonoscopy.

## 2018-03-11 ENCOUNTER — Encounter (HOSPITAL_COMMUNITY): Payer: Self-pay | Admitting: Gastroenterology

## 2018-03-11 NOTE — Telephone Encounter (Signed)
Reminder in epic °

## 2018-12-15 ENCOUNTER — Other Ambulatory Visit (HOSPITAL_COMMUNITY): Payer: Self-pay | Admitting: Internal Medicine

## 2018-12-15 DIAGNOSIS — Z1231 Encounter for screening mammogram for malignant neoplasm of breast: Secondary | ICD-10-CM

## 2018-12-21 ENCOUNTER — Ambulatory Visit (HOSPITAL_COMMUNITY): Payer: Medicaid Other

## 2019-01-04 ENCOUNTER — Ambulatory Visit (HOSPITAL_COMMUNITY)
Admission: RE | Admit: 2019-01-04 | Discharge: 2019-01-04 | Disposition: A | Discharge status: Home / Self Care | Payer: Medicaid Other | Source: Ambulatory Visit | Attending: Internal Medicine | Admitting: Internal Medicine

## 2019-01-04 ENCOUNTER — Encounter (HOSPITAL_COMMUNITY): Payer: Self-pay

## 2019-01-04 DIAGNOSIS — Z1231 Encounter for screening mammogram for malignant neoplasm of breast: Secondary | ICD-10-CM | POA: Diagnosis not present

## 2020-03-16 ENCOUNTER — Ambulatory Visit: Payer: Medicaid Other | Attending: Internal Medicine

## 2020-03-16 DIAGNOSIS — Z23 Encounter for immunization: Secondary | ICD-10-CM

## 2020-03-16 NOTE — Progress Notes (Signed)
   Covid-19 Vaccination Clinic  Name:  Dana Drake    MRN: [id number] DOB: 05/27/1956  03/16/2020  Ms. Dana Drake was observed post Covid-19 immunization for 15 minutes without incident. She was provided with Vaccine Information Sheet and instruction to access the V-Safe system.   Ms. Dana Drake was instructed to call 911 with any severe reactions post vaccine: Marland Kitchen Difficulty breathing  . Swelling of face and throat  . A fast heartbeat  . A bad rash all over body  . Dizziness and weakness   Immunizations Administered    Name Date Dose VIS Date Route   Moderna COVID-19 Vaccine 03/16/2020  9:23 AM 0.5 mL 11/07/2019 Intramuscular   Manufacturer: Moderna   Lot: [id number]   [place][id number]

## 2020-04-13 ENCOUNTER — Ambulatory Visit: Payer: Medicaid Other | Attending: Internal Medicine

## 2020-04-13 DIAGNOSIS — Z23 Encounter for immunization: Secondary | ICD-10-CM

## 2020-04-13 NOTE — Progress Notes (Signed)
   Covid-19 Vaccination Clinic  Name:  Dana Drake    MRN: [id number] DOB: [birth year]  04/13/2020  [name] was observed post Covid-19 immunization for 15 minutes without incident. She was provided with Vaccine Information Sheet and instruction to access the V-Safe system.   [name] was instructed to call 911 with any severe reactions post vaccine: Marland Kitchen Difficulty breathing  . Swelling of face and throat  . A fast heartbeat  . A bad rash all over body  . Dizziness and weakness   Immunizations Administered    Name Date Dose VIS Date Route   Moderna COVID-19 Vaccine 04/13/2020 10:51 AM 0.5 mL 11/2019 Intramuscular   Manufacturer: Moderna   Lot: [id number]   [id number]

## 2020-04-25 ENCOUNTER — Other Ambulatory Visit: Payer: Self-pay | Admitting: Internal Medicine

## 2020-04-25 ENCOUNTER — Other Ambulatory Visit (HOSPITAL_COMMUNITY): Payer: Self-pay | Admitting: Internal Medicine

## 2020-04-25 DIAGNOSIS — Z122 Encounter for screening for malignant neoplasm of respiratory organs: Secondary | ICD-10-CM

## 2020-05-10 ENCOUNTER — Other Ambulatory Visit: Payer: Self-pay

## 2020-05-10 ENCOUNTER — Encounter (HOSPITAL_COMMUNITY): Payer: Self-pay

## 2020-05-10 ENCOUNTER — Ambulatory Visit (HOSPITAL_COMMUNITY)
Admission: RE | Admit: 2020-05-10 | Discharge: 2020-05-10 | Disposition: A | Discharge status: Home / Self Care | Payer: Medicaid Other | Source: Ambulatory Visit | Attending: Internal Medicine | Admitting: Internal Medicine

## 2020-05-10 ENCOUNTER — Ambulatory Visit (HOSPITAL_COMMUNITY): Payer: Medicaid Other

## 2020-05-10 DIAGNOSIS — Z122 Encounter for screening for malignant neoplasm of respiratory organs: Secondary | ICD-10-CM

## 2021-06-05 ENCOUNTER — Other Ambulatory Visit (HOSPITAL_COMMUNITY): Payer: Self-pay | Admitting: Internal Medicine

## 2021-06-05 DIAGNOSIS — Z1231 Encounter for screening mammogram for malignant neoplasm of breast: Secondary | ICD-10-CM

## 2021-06-12 ENCOUNTER — Ambulatory Visit (HOSPITAL_COMMUNITY)
Admission: RE | Admit: 2021-06-12 | Discharge: 2021-06-12 | Disposition: A | Discharge status: Home / Self Care | Payer: Medicaid Other | Source: Ambulatory Visit | Attending: Internal Medicine | Admitting: Internal Medicine

## 2021-06-12 ENCOUNTER — Other Ambulatory Visit: Payer: Self-pay

## 2021-06-12 DIAGNOSIS — Z1231 Encounter for screening mammogram for malignant neoplasm of breast: Secondary | ICD-10-CM | POA: Insufficient documentation

## 2021-09-04 DIAGNOSIS — I10 Essential (primary) hypertension: Secondary | ICD-10-CM | POA: Diagnosis not present

## 2021-09-04 DIAGNOSIS — M1990 Unspecified osteoarthritis, unspecified site: Secondary | ICD-10-CM | POA: Diagnosis not present

## 2021-09-04 DIAGNOSIS — F17210 Nicotine dependence, cigarettes, uncomplicated: Secondary | ICD-10-CM | POA: Diagnosis not present

## 2021-09-04 DIAGNOSIS — J410 Simple chronic bronchitis: Secondary | ICD-10-CM | POA: Diagnosis not present

## 2021-09-04 DIAGNOSIS — Z23 Encounter for immunization: Secondary | ICD-10-CM | POA: Diagnosis not present

## 2021-09-04 DIAGNOSIS — E785 Hyperlipidemia, unspecified: Secondary | ICD-10-CM | POA: Diagnosis not present

## 2021-09-05 ENCOUNTER — Other Ambulatory Visit (HOSPITAL_COMMUNITY): Payer: Self-pay | Admitting: Gerontology

## 2021-09-05 DIAGNOSIS — Z780 Asymptomatic menopausal state: Secondary | ICD-10-CM

## 2021-09-10 ENCOUNTER — Ambulatory Visit (HOSPITAL_COMMUNITY)
Admission: RE | Admit: 2021-09-10 | Discharge: 2021-09-10 | Disposition: A | Discharge status: Home / Self Care | Payer: Medicaid Other | Source: Ambulatory Visit | Attending: Gerontology | Admitting: Gerontology

## 2021-09-10 ENCOUNTER — Other Ambulatory Visit: Payer: Self-pay

## 2021-09-10 ENCOUNTER — Other Ambulatory Visit (HOSPITAL_COMMUNITY)
Admission: RE | Admit: 2021-09-10 | Discharge: 2021-09-10 | Disposition: A | Discharge status: Home / Self Care | Payer: Medicaid Other | Source: Ambulatory Visit | Attending: Internal Medicine | Admitting: Internal Medicine

## 2021-09-10 DIAGNOSIS — I10 Essential (primary) hypertension: Secondary | ICD-10-CM | POA: Insufficient documentation

## 2021-09-10 DIAGNOSIS — E785 Hyperlipidemia, unspecified: Secondary | ICD-10-CM | POA: Insufficient documentation

## 2021-09-10 DIAGNOSIS — M810 Age-related osteoporosis without current pathological fracture: Secondary | ICD-10-CM | POA: Diagnosis not present

## 2021-09-10 DIAGNOSIS — Z780 Asymptomatic menopausal state: Secondary | ICD-10-CM | POA: Diagnosis not present

## 2021-09-10 LAB — LIPID PANEL
Cholesterol: 166 mg/dL (ref 0–200)
HDL: 64 mg/dL (ref >40)
LDL Cholesterol: 96 mg/dL (ref 0–99)
Total CHOL/HDL Ratio: 2.6 RATIO
Triglycerides: 30 mg/dL (ref <150)
VLDL: 6 mg/dL (ref 0–40)

## 2021-09-10 LAB — BASIC METABOLIC PANEL
Anion gap: 9 (ref 5–15)
BUN: 22 mg/dL (ref 8–23)
CO2: 25 mmol/L (ref 22–32)
Calcium: 9.6 mg/dL (ref 8.9–10.3)
Chloride: 104 mmol/L (ref 98–111)
Creatinine, Ser: 0.43 mg/dL — ABNORMAL LOW (ref 0.44–1.00)
GFR, Estimated: >60 mL/min (ref >60)
Glucose, Bld: 83 mg/dL (ref 70–99)
Potassium: 3.7 mmol/L (ref 3.5–5.1)
Sodium: 138 mmol/L (ref 135–145)

## 2021-09-10 LAB — CBC WITH DIFFERENTIAL/PLATELET
Abs Immature Granulocytes: 0.01 10*3/uL (ref 0.00–0.07)
Basophils Absolute: 0 10*3/uL (ref 0.0–0.1)
Basophils Relative: 1 %
Eosinophils Absolute: 0.1 10*3/uL (ref 0.0–0.5)
Eosinophils Relative: 3 %
HCT: 37.9 % (ref 36.0–46.0)
Hemoglobin: 12.7 g/dL (ref 12.0–15.0)
Immature Granulocytes: 0 %
Lymphocytes Relative: 57 %
Lymphs Abs: 2.2 10*3/uL (ref 0.7–4.0)
MCH: 29.4 pg (ref 26.0–34.0)
MCHC: 33.5 g/dL (ref 30.0–36.0)
MCV: 87.7 fL (ref 80.0–100.0)
Monocytes Absolute: 0.4 10*3/uL (ref 0.1–1.0)
Monocytes Relative: 11 %
Neutro Abs: 1.1 10*3/uL — ABNORMAL LOW (ref 1.7–7.7)
Neutrophils Relative %: 28 %
Platelets: 209 10*3/uL (ref 150–400)
RBC: 4.32 MIL/uL (ref 3.87–5.11)
RDW: 13.6 % (ref 11.5–15.5)
WBC: 3.9 10*3/uL — ABNORMAL LOW (ref 4.0–10.5)
nRBC: 0 % (ref 0.0–0.2)

## 2021-09-10 LAB — HEPATIC FUNCTION PANEL
ALT: 23 U/L (ref 0–44)
AST: 25 U/L (ref 15–41)
Albumin: 4.3 g/dL (ref 3.5–5.0)
Alkaline Phosphatase: 208 U/L — ABNORMAL HIGH (ref 38–126)
Bilirubin, Direct: 0.1 mg/dL (ref 0.0–0.2)
Indirect Bilirubin: 0.7 mg/dL (ref 0.3–0.9)
Total Bilirubin: 0.8 mg/dL (ref 0.3–1.2)
Total Protein: 7.7 g/dL (ref 6.5–8.1)

## 2021-10-04 DIAGNOSIS — F17200 Nicotine dependence, unspecified, uncomplicated: Secondary | ICD-10-CM | POA: Diagnosis not present

## 2021-10-04 DIAGNOSIS — I10 Essential (primary) hypertension: Secondary | ICD-10-CM | POA: Diagnosis not present

## 2021-11-04 DIAGNOSIS — I10 Essential (primary) hypertension: Secondary | ICD-10-CM | POA: Diagnosis not present

## 2021-11-04 DIAGNOSIS — M1990 Unspecified osteoarthritis, unspecified site: Secondary | ICD-10-CM | POA: Diagnosis not present

## 2021-12-04 DIAGNOSIS — I10 Essential (primary) hypertension: Secondary | ICD-10-CM | POA: Diagnosis not present

## 2021-12-04 DIAGNOSIS — F17200 Nicotine dependence, unspecified, uncomplicated: Secondary | ICD-10-CM | POA: Diagnosis not present

## 2022-01-04 DIAGNOSIS — I10 Essential (primary) hypertension: Secondary | ICD-10-CM | POA: Diagnosis not present

## 2022-01-04 DIAGNOSIS — M1990 Unspecified osteoarthritis, unspecified site: Secondary | ICD-10-CM | POA: Diagnosis not present

## 2022-02-03 DIAGNOSIS — I10 Essential (primary) hypertension: Secondary | ICD-10-CM | POA: Diagnosis not present

## 2022-02-03 DIAGNOSIS — M1990 Unspecified osteoarthritis, unspecified site: Secondary | ICD-10-CM | POA: Diagnosis not present

## 2022-02-16 DIAGNOSIS — Z0001 Encounter for general adult medical examination with abnormal findings: Secondary | ICD-10-CM | POA: Diagnosis not present

## 2022-02-16 DIAGNOSIS — Z1389 Encounter for screening for other disorder: Secondary | ICD-10-CM | POA: Diagnosis not present

## 2022-02-16 DIAGNOSIS — E785 Hyperlipidemia, unspecified: Secondary | ICD-10-CM | POA: Diagnosis not present

## 2022-02-16 DIAGNOSIS — M810 Age-related osteoporosis without current pathological fracture: Secondary | ICD-10-CM | POA: Diagnosis not present

## 2022-02-16 DIAGNOSIS — F17210 Nicotine dependence, cigarettes, uncomplicated: Secondary | ICD-10-CM | POA: Diagnosis not present

## 2022-02-16 DIAGNOSIS — I10 Essential (primary) hypertension: Secondary | ICD-10-CM | POA: Diagnosis not present

## 2022-02-23 ENCOUNTER — Other Ambulatory Visit: Payer: Self-pay

## 2022-02-23 ENCOUNTER — Other Ambulatory Visit (HOSPITAL_COMMUNITY): Payer: Self-pay | Admitting: Gerontology

## 2022-02-23 ENCOUNTER — Ambulatory Visit (HOSPITAL_COMMUNITY)
Admission: RE | Admit: 2022-02-23 | Discharge: 2022-02-23 | Disposition: A | Discharge status: Home / Self Care | Payer: Medicare Other | Source: Ambulatory Visit | Attending: Gerontology | Admitting: Gerontology

## 2022-02-23 ENCOUNTER — Other Ambulatory Visit (HOSPITAL_COMMUNITY)
Admission: RE | Admit: 2022-02-23 | Discharge: 2022-02-23 | Disposition: A | Discharge status: Home / Self Care | Payer: Medicare Other | Source: Ambulatory Visit | Attending: Internal Medicine | Admitting: Internal Medicine

## 2022-02-23 DIAGNOSIS — R059 Cough, unspecified: Secondary | ICD-10-CM

## 2022-02-23 DIAGNOSIS — E785 Hyperlipidemia, unspecified: Secondary | ICD-10-CM | POA: Diagnosis not present

## 2022-02-23 DIAGNOSIS — I10 Essential (primary) hypertension: Secondary | ICD-10-CM | POA: Insufficient documentation

## 2022-02-23 DIAGNOSIS — Z0001 Encounter for general adult medical examination with abnormal findings: Secondary | ICD-10-CM | POA: Insufficient documentation

## 2022-02-23 DIAGNOSIS — J439 Emphysema, unspecified: Secondary | ICD-10-CM | POA: Diagnosis not present

## 2022-02-23 LAB — LIPID PANEL
Cholesterol: 148 mg/dL (ref 0–200)
HDL: 61 mg/dL (ref >40)
LDL Cholesterol: 79 mg/dL (ref 0–99)
Total CHOL/HDL Ratio: 2.4 RATIO
Triglycerides: 38 mg/dL (ref <150)
VLDL: 8 mg/dL (ref 0–40)

## 2022-02-23 LAB — CBC WITH DIFFERENTIAL/PLATELET
Abs Immature Granulocytes: 0 10*3/uL (ref 0.00–0.07)
Basophils Absolute: 0 10*3/uL (ref 0.0–0.1)
Basophils Relative: 1 %
Eosinophils Absolute: 0.2 10*3/uL (ref 0.0–0.5)
Eosinophils Relative: 6 %
HCT: 39.9 % (ref 36.0–46.0)
Hemoglobin: 13.6 g/dL (ref 12.0–15.0)
Immature Granulocytes: 0 %
Lymphocytes Relative: 43 %
Lymphs Abs: 1.4 10*3/uL (ref 0.7–4.0)
MCH: 30.2 pg (ref 26.0–34.0)
MCHC: 34.1 g/dL (ref 30.0–36.0)
MCV: 88.7 fL (ref 80.0–100.0)
Monocytes Absolute: 0.5 10*3/uL (ref 0.1–1.0)
Monocytes Relative: 14 %
Neutro Abs: 1.2 10*3/uL — ABNORMAL LOW (ref 1.7–7.7)
Neutrophils Relative %: 36 %
Platelets: 173 10*3/uL (ref 150–400)
RBC: 4.5 MIL/uL (ref 3.87–5.11)
RDW: 13.1 % (ref 11.5–15.5)
WBC: 3.2 10*3/uL — ABNORMAL LOW (ref 4.0–10.5)
nRBC: 0 % (ref 0.0–0.2)

## 2022-02-23 LAB — BASIC METABOLIC PANEL
Anion gap: 9 (ref 5–15)
BUN: 20 mg/dL (ref 8–23)
CO2: 26 mmol/L (ref 22–32)
Calcium: 9.9 mg/dL (ref 8.9–10.3)
Chloride: 100 mmol/L (ref 98–111)
Creatinine, Ser: 0.45 mg/dL (ref 0.44–1.00)
GFR, Estimated: >60 mL/min (ref >60)
Glucose, Bld: 99 mg/dL (ref 70–99)
Potassium: 3.7 mmol/L (ref 3.5–5.1)
Sodium: 135 mmol/L (ref 135–145)

## 2022-02-23 LAB — HEPATIC FUNCTION PANEL
ALT: 34 U/L (ref 0–44)
AST: 27 U/L (ref 15–41)
Albumin: 4.5 g/dL (ref 3.5–5.0)
Alkaline Phosphatase: 181 U/L — ABNORMAL HIGH (ref 38–126)
Bilirubin, Direct: 0.1 mg/dL (ref 0.0–0.2)
Indirect Bilirubin: 0.5 mg/dL (ref 0.3–0.9)
Total Bilirubin: 0.6 mg/dL (ref 0.3–1.2)
Total Protein: 8 g/dL (ref 6.5–8.1)

## 2022-03-19 DIAGNOSIS — I10 Essential (primary) hypertension: Secondary | ICD-10-CM | POA: Diagnosis not present

## 2022-03-19 DIAGNOSIS — M1990 Unspecified osteoarthritis, unspecified site: Secondary | ICD-10-CM | POA: Diagnosis not present

## 2022-04-18 DIAGNOSIS — I10 Essential (primary) hypertension: Secondary | ICD-10-CM | POA: Diagnosis not present

## 2022-04-18 DIAGNOSIS — M1990 Unspecified osteoarthritis, unspecified site: Secondary | ICD-10-CM | POA: Diagnosis not present

## 2022-05-19 DIAGNOSIS — I10 Essential (primary) hypertension: Secondary | ICD-10-CM | POA: Diagnosis not present

## 2022-05-19 DIAGNOSIS — E785 Hyperlipidemia, unspecified: Secondary | ICD-10-CM | POA: Diagnosis not present

## 2022-06-18 DIAGNOSIS — M1990 Unspecified osteoarthritis, unspecified site: Secondary | ICD-10-CM | POA: Diagnosis not present

## 2022-06-18 DIAGNOSIS — I10 Essential (primary) hypertension: Secondary | ICD-10-CM | POA: Diagnosis not present

## 2022-07-19 DIAGNOSIS — E782 Mixed hyperlipidemia: Secondary | ICD-10-CM | POA: Diagnosis not present

## 2022-07-19 DIAGNOSIS — I10 Essential (primary) hypertension: Secondary | ICD-10-CM | POA: Diagnosis not present

## 2022-08-14 DIAGNOSIS — F17200 Nicotine dependence, unspecified, uncomplicated: Secondary | ICD-10-CM | POA: Diagnosis not present

## 2022-08-14 DIAGNOSIS — E785 Hyperlipidemia, unspecified: Secondary | ICD-10-CM | POA: Diagnosis not present

## 2022-08-14 DIAGNOSIS — J410 Simple chronic bronchitis: Secondary | ICD-10-CM | POA: Diagnosis not present

## 2022-08-14 DIAGNOSIS — I10 Essential (primary) hypertension: Secondary | ICD-10-CM | POA: Diagnosis not present

## 2022-08-14 DIAGNOSIS — Z23 Encounter for immunization: Secondary | ICD-10-CM | POA: Diagnosis not present

## 2022-09-13 DIAGNOSIS — I10 Essential (primary) hypertension: Secondary | ICD-10-CM | POA: Diagnosis not present

## 2022-09-13 DIAGNOSIS — J410 Simple chronic bronchitis: Secondary | ICD-10-CM | POA: Diagnosis not present

## 2022-10-21 DIAGNOSIS — E785 Hyperlipidemia, unspecified: Secondary | ICD-10-CM | POA: Diagnosis not present

## 2022-10-21 DIAGNOSIS — I10 Essential (primary) hypertension: Secondary | ICD-10-CM | POA: Diagnosis not present

## 2022-11-20 DIAGNOSIS — E785 Hyperlipidemia, unspecified: Secondary | ICD-10-CM | POA: Diagnosis not present

## 2022-11-20 DIAGNOSIS — I10 Essential (primary) hypertension: Secondary | ICD-10-CM | POA: Diagnosis not present

## 2022-12-21 DIAGNOSIS — E785 Hyperlipidemia, unspecified: Secondary | ICD-10-CM | POA: Diagnosis not present

## 2022-12-21 DIAGNOSIS — I10 Essential (primary) hypertension: Secondary | ICD-10-CM | POA: Diagnosis not present

## 2023-01-21 DIAGNOSIS — E785 Hyperlipidemia, unspecified: Secondary | ICD-10-CM | POA: Diagnosis not present

## 2023-01-21 DIAGNOSIS — I10 Essential (primary) hypertension: Secondary | ICD-10-CM | POA: Diagnosis not present

## 2023-02-26 DIAGNOSIS — Z1389 Encounter for screening for other disorder: Secondary | ICD-10-CM | POA: Diagnosis not present

## 2023-02-26 DIAGNOSIS — J410 Simple chronic bronchitis: Secondary | ICD-10-CM | POA: Diagnosis not present

## 2023-02-26 DIAGNOSIS — I10 Essential (primary) hypertension: Secondary | ICD-10-CM | POA: Diagnosis not present

## 2023-02-26 DIAGNOSIS — Z0001 Encounter for general adult medical examination with abnormal findings: Secondary | ICD-10-CM | POA: Diagnosis not present

## 2023-02-26 DIAGNOSIS — F17210 Nicotine dependence, cigarettes, uncomplicated: Secondary | ICD-10-CM | POA: Diagnosis not present

## 2023-02-26 DIAGNOSIS — M1990 Unspecified osteoarthritis, unspecified site: Secondary | ICD-10-CM | POA: Diagnosis not present

## 2023-02-26 DIAGNOSIS — F17200 Nicotine dependence, unspecified, uncomplicated: Secondary | ICD-10-CM | POA: Diagnosis not present

## 2023-02-26 DIAGNOSIS — E785 Hyperlipidemia, unspecified: Secondary | ICD-10-CM | POA: Diagnosis not present

## 2023-02-26 DIAGNOSIS — Z1331 Encounter for screening for depression: Secondary | ICD-10-CM | POA: Diagnosis not present

## 2023-03-15 DIAGNOSIS — E785 Hyperlipidemia, unspecified: Secondary | ICD-10-CM | POA: Diagnosis not present

## 2023-03-15 DIAGNOSIS — I10 Essential (primary) hypertension: Secondary | ICD-10-CM | POA: Diagnosis not present

## 2023-03-15 DIAGNOSIS — Z0001 Encounter for general adult medical examination with abnormal findings: Secondary | ICD-10-CM | POA: Diagnosis not present

## 2023-03-29 DIAGNOSIS — E785 Hyperlipidemia, unspecified: Secondary | ICD-10-CM | POA: Diagnosis not present

## 2023-03-29 DIAGNOSIS — I10 Essential (primary) hypertension: Secondary | ICD-10-CM | POA: Diagnosis not present

## 2023-04-28 DIAGNOSIS — I10 Essential (primary) hypertension: Secondary | ICD-10-CM | POA: Diagnosis not present

## 2023-04-28 DIAGNOSIS — E785 Hyperlipidemia, unspecified: Secondary | ICD-10-CM | POA: Diagnosis not present

## 2023-05-29 DIAGNOSIS — I10 Essential (primary) hypertension: Secondary | ICD-10-CM | POA: Diagnosis not present

## 2023-05-29 DIAGNOSIS — E785 Hyperlipidemia, unspecified: Secondary | ICD-10-CM | POA: Diagnosis not present

## 2023-06-28 DIAGNOSIS — E785 Hyperlipidemia, unspecified: Secondary | ICD-10-CM | POA: Diagnosis not present

## 2023-06-28 DIAGNOSIS — I10 Essential (primary) hypertension: Secondary | ICD-10-CM | POA: Diagnosis not present

## 2023-07-29 DIAGNOSIS — E785 Hyperlipidemia, unspecified: Secondary | ICD-10-CM | POA: Diagnosis not present

## 2023-07-29 DIAGNOSIS — I10 Essential (primary) hypertension: Secondary | ICD-10-CM | POA: Diagnosis not present

## 2023-08-20 DIAGNOSIS — Z1331 Encounter for screening for depression: Secondary | ICD-10-CM | POA: Diagnosis not present

## 2023-08-20 DIAGNOSIS — E785 Hyperlipidemia, unspecified: Secondary | ICD-10-CM | POA: Diagnosis not present

## 2023-08-20 DIAGNOSIS — J410 Simple chronic bronchitis: Secondary | ICD-10-CM | POA: Diagnosis not present

## 2023-08-20 DIAGNOSIS — Z7189 Other specified counseling: Secondary | ICD-10-CM | POA: Diagnosis not present

## 2023-08-20 DIAGNOSIS — M1990 Unspecified osteoarthritis, unspecified site: Secondary | ICD-10-CM | POA: Diagnosis not present

## 2023-08-20 DIAGNOSIS — I10 Essential (primary) hypertension: Secondary | ICD-10-CM | POA: Diagnosis not present

## 2023-08-20 DIAGNOSIS — Z1389 Encounter for screening for other disorder: Secondary | ICD-10-CM | POA: Diagnosis not present

## 2023-08-20 DIAGNOSIS — Z23 Encounter for immunization: Secondary | ICD-10-CM | POA: Diagnosis not present

## 2023-08-20 DIAGNOSIS — F17200 Nicotine dependence, unspecified, uncomplicated: Secondary | ICD-10-CM | POA: Diagnosis not present

## 2023-09-19 DIAGNOSIS — E785 Hyperlipidemia, unspecified: Secondary | ICD-10-CM | POA: Diagnosis not present

## 2023-09-19 DIAGNOSIS — I10 Essential (primary) hypertension: Secondary | ICD-10-CM | POA: Diagnosis not present

## 2023-10-20 DIAGNOSIS — E785 Hyperlipidemia, unspecified: Secondary | ICD-10-CM | POA: Diagnosis not present

## 2023-10-20 DIAGNOSIS — I10 Essential (primary) hypertension: Secondary | ICD-10-CM | POA: Diagnosis not present

## 2023-11-19 DIAGNOSIS — I10 Essential (primary) hypertension: Secondary | ICD-10-CM | POA: Diagnosis not present

## 2023-11-19 DIAGNOSIS — E785 Hyperlipidemia, unspecified: Secondary | ICD-10-CM | POA: Diagnosis not present

## 2024-02-08 IMAGING — DX DG CHEST 2V
2 series · 2 of 2 positions shown · non-contrast
Comparison: 04/29/2012

CLINICAL DATA: 65-year-old female with a history of cough

EXAM:
CHEST - 2 VIEW

[chest pa]
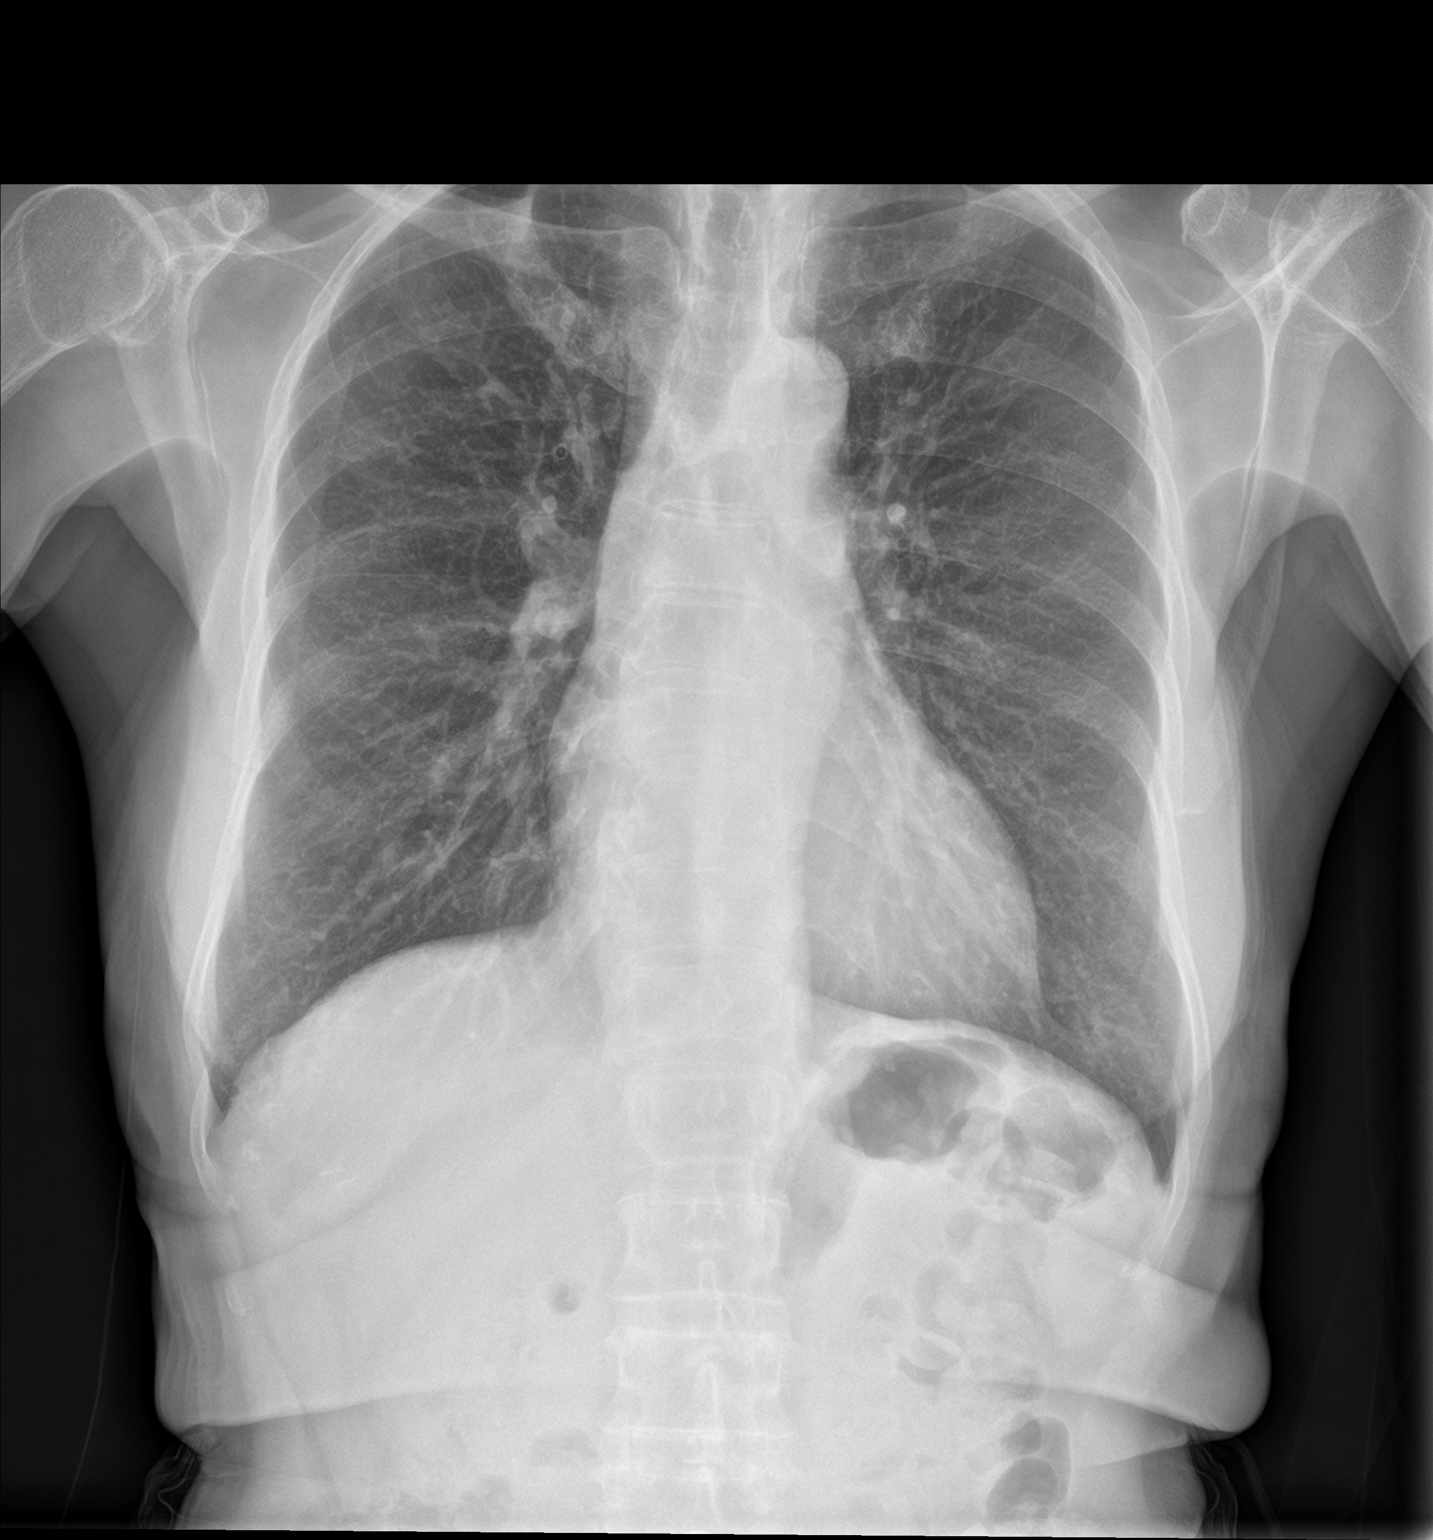

[chest lat]
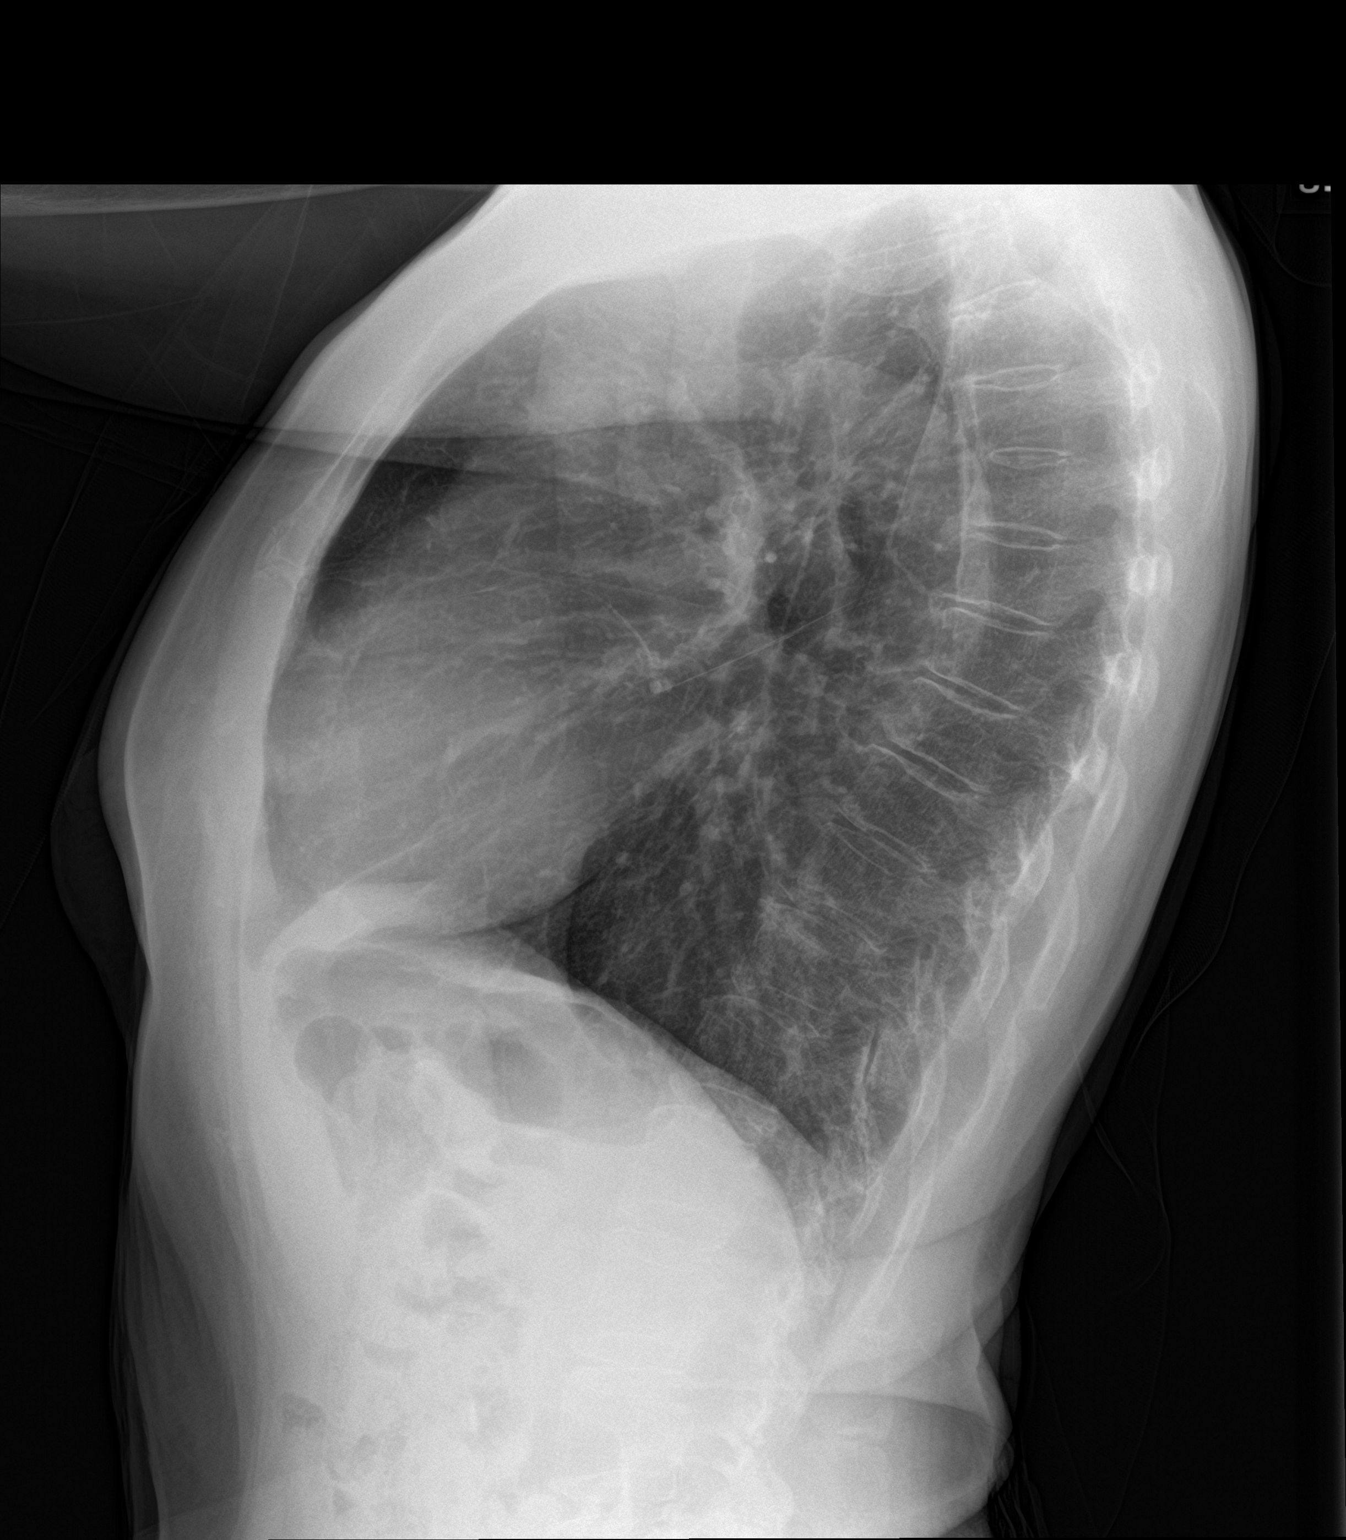

[2 of 2 positions shown; findings below may reference images not displayed]

FINDINGS: Cardiomediastinal silhouette unchanged in size and contour. No
evidence of central vascular congestion. No interlobular septal
thickening.

Stigmata of emphysema, with increased retrosternal airspace,
flattened hemidiaphragms, increased AP diameter, and hyperinflation
on the AP view. Coarsened interstitial markings. No confluent
airspace disease.

No pneumothorax or pleural effusion.

No acute displaced fracture. Degenerative changes of the spine.
IMPRESSION: Emphysema/chronic lung changes with no definite evidence of acute
cardiopulmonary disease

## 2024-02-25 DIAGNOSIS — F17210 Nicotine dependence, cigarettes, uncomplicated: Secondary | ICD-10-CM | POA: Diagnosis not present

## 2024-02-25 DIAGNOSIS — M810 Age-related osteoporosis without current pathological fracture: Secondary | ICD-10-CM | POA: Diagnosis not present

## 2024-02-25 DIAGNOSIS — I10 Essential (primary) hypertension: Secondary | ICD-10-CM | POA: Diagnosis not present

## 2024-02-25 DIAGNOSIS — E785 Hyperlipidemia, unspecified: Secondary | ICD-10-CM | POA: Diagnosis not present

## 2024-03-27 DIAGNOSIS — I10 Essential (primary) hypertension: Secondary | ICD-10-CM | POA: Diagnosis not present

## 2024-03-27 DIAGNOSIS — E785 Hyperlipidemia, unspecified: Secondary | ICD-10-CM | POA: Diagnosis not present

## 2024-04-26 DIAGNOSIS — E785 Hyperlipidemia, unspecified: Secondary | ICD-10-CM | POA: Diagnosis not present

## 2024-04-26 DIAGNOSIS — I10 Essential (primary) hypertension: Secondary | ICD-10-CM | POA: Diagnosis not present

## 2024-05-27 DIAGNOSIS — E785 Hyperlipidemia, unspecified: Secondary | ICD-10-CM | POA: Diagnosis not present

## 2024-05-27 DIAGNOSIS — I10 Essential (primary) hypertension: Secondary | ICD-10-CM | POA: Diagnosis not present

## 2024-06-26 DIAGNOSIS — I10 Essential (primary) hypertension: Secondary | ICD-10-CM | POA: Diagnosis not present

## 2024-06-26 DIAGNOSIS — E785 Hyperlipidemia, unspecified: Secondary | ICD-10-CM | POA: Diagnosis not present

## 2024-07-27 DIAGNOSIS — E785 Hyperlipidemia, unspecified: Secondary | ICD-10-CM | POA: Diagnosis not present

## 2024-07-27 DIAGNOSIS — I10 Essential (primary) hypertension: Secondary | ICD-10-CM | POA: Diagnosis not present

## 2024-09-04 ENCOUNTER — Other Ambulatory Visit (HOSPITAL_COMMUNITY): Payer: Self-pay | Admitting: Internal Medicine

## 2024-09-04 ENCOUNTER — Other Ambulatory Visit (HOSPITAL_COMMUNITY): Payer: Self-pay | Admitting: Gerontology

## 2024-09-04 ENCOUNTER — Ambulatory Visit (HOSPITAL_COMMUNITY)
Admission: RE | Admit: 2024-09-04 | Discharge: 2024-09-04 | Disposition: A | Discharge status: Home / Self Care | Source: Ambulatory Visit | Attending: Internal Medicine | Admitting: Internal Medicine

## 2024-09-04 DIAGNOSIS — M1990 Unspecified osteoarthritis, unspecified site: Secondary | ICD-10-CM | POA: Diagnosis not present

## 2024-09-04 DIAGNOSIS — Z1389 Encounter for screening for other disorder: Secondary | ICD-10-CM | POA: Diagnosis not present

## 2024-09-04 DIAGNOSIS — E785 Hyperlipidemia, unspecified: Secondary | ICD-10-CM | POA: Diagnosis not present

## 2024-09-04 DIAGNOSIS — Z87891 Personal history of nicotine dependence: Secondary | ICD-10-CM

## 2024-09-04 DIAGNOSIS — I10 Essential (primary) hypertension: Secondary | ICD-10-CM | POA: Diagnosis not present

## 2024-09-04 DIAGNOSIS — F17200 Nicotine dependence, unspecified, uncomplicated: Secondary | ICD-10-CM | POA: Diagnosis not present

## 2024-09-04 DIAGNOSIS — Z23 Encounter for immunization: Secondary | ICD-10-CM | POA: Diagnosis not present

## 2024-09-04 DIAGNOSIS — Z1231 Encounter for screening mammogram for malignant neoplasm of breast: Secondary | ICD-10-CM | POA: Insufficient documentation

## 2024-09-04 DIAGNOSIS — Z0001 Encounter for general adult medical examination with abnormal findings: Secondary | ICD-10-CM | POA: Diagnosis not present

## 2024-09-04 DIAGNOSIS — M810 Age-related osteoporosis without current pathological fracture: Secondary | ICD-10-CM | POA: Diagnosis not present

## 2024-09-05 ENCOUNTER — Encounter (INDEPENDENT_AMBULATORY_CARE_PROVIDER_SITE_OTHER): Payer: Self-pay | Admitting: *Deleted

## 2024-09-19 ENCOUNTER — Telehealth: Payer: Self-pay

## 2024-09-19 NOTE — Telephone Encounter (Signed)
 Who is your primary care physician: Dr.Tesfaye Fanta  Reasons for the colonoscopy: Hx polyps  Have you had a colonoscopy before?  Yes 03-07-2018 Dana Fields  Do you have family history of colon cancer? no  Previous colonoscopy with polyps removed? yes  Do you have a history colorectal cancer?   no  Are you diabetic? If yes, Type 1 or Type 2?    no  Do you have a prosthetic or mechanical heart valve? no  Do you have a pacemaker/defibrillator?   no  Have you had endocarditis/atrial fibrillation? no  Have you had joint replacement within the last 12 months?  no  Do you tend to be constipated or have to use laxatives? no  Do you have any history of drugs or alchohol?  no  Do you use supplemental oxygen?  no  Have you had a stroke or heart attack within the last 6 months? Not answered  Do you take weight loss medication?  no  For female patients: have you had a hysterectomy?  no                                     are you post menopausal?       no                                            do you still have your menstrual cycle? no      Do you take any blood-thinning medications such as: (aspirin, warfarin, Plavix, Aggrenox)  no  If yes we need the name, milligram, dosage and who is prescribing doctor no Current Outpatient Medications on File Prior to Visit  Medication Sig Dispense Refill   alendronate (FOSAMAX) 70 MG tablet SMARTSIG:1 By Mouth Once a Week     amLODipine (NORVASC) 2.5 MG tablet Take 2.5 mg by mouth daily.     atorvastatin (LIPITOR) 40 MG tablet Take 40 mg by mouth daily.     metoprolol tartrate (LOPRESSOR) 25 MG tablet      No current facility-administered medications on file prior to visit.    No Known Allergies   Pharmacy: [facility] [address]  Primary Insurance Name: [facility] [id number]  Best number where you can be reached: [phone]

## 2024-09-24 NOTE — Telephone Encounter (Signed)
 Please ask patient if she has had any close first degree relatives diagnosed with colon cancer or colon polyps. If she is having any rectal bleeding, changes in bowel habits, new diarrhea or constipation then make OV. If not no OV needed. Last colonoscopy in 2019 however Dr. Harvey stated she did not need repeat for 10 years - would be due in 2029.

## 2024-09-25 ENCOUNTER — Encounter (INDEPENDENT_AMBULATORY_CARE_PROVIDER_SITE_OTHER): Payer: Self-pay | Admitting: *Deleted

## 2024-09-25 NOTE — Telephone Encounter (Signed)
 Letter sent to pcp advising patient not due until 03/2028, referral closed

## 2024-09-25 NOTE — Telephone Encounter (Signed)
 Spoke with pt. She denies having any GI symptoms, no fhx colon cancer/polyps. Aware in that case she would not be due until 2029. She voiced understanding. Pt already has a recall in system.

## 2024-10-04 DIAGNOSIS — E785 Hyperlipidemia, unspecified: Secondary | ICD-10-CM | POA: Diagnosis not present

## 2024-10-04 DIAGNOSIS — I10 Essential (primary) hypertension: Secondary | ICD-10-CM | POA: Diagnosis not present
